# Patient Record
Sex: Female | Born: 1975 | Race: Black or African American | Hispanic: No | Smoking: Former smoker
Health system: Southern US, Community
[De-identification: ages and names within clinical notes are randomized; demographics above are authoritative.]

---
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
## 2017-11-20 ENCOUNTER — Encounter: Payer: Self-pay | Admitting: Adult Health

## 2017-11-26 ENCOUNTER — Encounter (HOSPITAL_COMMUNITY): Payer: Self-pay | Admitting: Hematology

## 2017-11-26 ENCOUNTER — Inpatient Hospital Stay (HOSPITAL_COMMUNITY): Payer: BLUE CROSS/BLUE SHIELD | Attending: Hematology | Admitting: Hematology

## 2017-11-26 VITALS — BP 143/76 | HR 103 | Temp 98.2°F | Resp 20 | Ht 67.0 in | Wt 376.0 lb

## 2017-11-26 DIAGNOSIS — Z79899 Other long term (current) drug therapy: Secondary | ICD-10-CM | POA: Diagnosis not present

## 2017-11-26 DIAGNOSIS — Z7952 Long term (current) use of systemic steroids: Secondary | ICD-10-CM

## 2017-11-26 DIAGNOSIS — D693 Immune thrombocytopenic purpura: Secondary | ICD-10-CM | POA: Diagnosis not present

## 2017-11-26 DIAGNOSIS — M25569 Pain in unspecified knee: Secondary | ICD-10-CM | POA: Diagnosis not present

## 2017-11-26 DIAGNOSIS — D508 Other iron deficiency anemias: Secondary | ICD-10-CM | POA: Diagnosis not present

## 2017-11-26 DIAGNOSIS — Z5112 Encounter for antineoplastic immunotherapy: Secondary | ICD-10-CM | POA: Diagnosis not present

## 2017-11-26 DIAGNOSIS — R040 Epistaxis: Secondary | ICD-10-CM | POA: Diagnosis not present

## 2017-11-26 DIAGNOSIS — M549 Dorsalgia, unspecified: Secondary | ICD-10-CM | POA: Diagnosis not present

## 2017-11-26 DIAGNOSIS — C9000 Multiple myeloma not having achieved remission: Secondary | ICD-10-CM

## 2017-11-26 DIAGNOSIS — K1379 Other lesions of oral mucosa: Secondary | ICD-10-CM | POA: Diagnosis not present

## 2017-11-26 DIAGNOSIS — Z87891 Personal history of nicotine dependence: Secondary | ICD-10-CM | POA: Insufficient documentation

## 2017-11-26 DIAGNOSIS — R58 Hemorrhage, not elsewhere classified: Secondary | ICD-10-CM | POA: Insufficient documentation

## 2017-11-26 DIAGNOSIS — R350 Frequency of micturition: Secondary | ICD-10-CM | POA: Diagnosis not present

## 2017-11-26 LAB — COMPREHENSIVE METABOLIC PANEL
ALT: 22 U/L (ref 14–54)
ANION GAP: 9 (ref 5–15)
AST: 25 U/L (ref 15–41)
Albumin: 3.3 g/dL — ABNORMAL LOW (ref 3.5–5.0)
Alkaline Phosphatase: 45 U/L (ref 38–126)
BUN: 21 mg/dL — ABNORMAL HIGH (ref 6–20)
CHLORIDE: 100 mmol/L — AB (ref 101–111)
CO2: 23 mmol/L (ref 22–32)
CREATININE: 0.74 mg/dL (ref 0.44–1.00)
Calcium: 9 mg/dL (ref 8.9–10.3)
GFR calc Af Amer: >60 mL/min (ref >60)
GFR calc non Af Amer: >60 mL/min (ref >60)
Glucose, Bld: 132 mg/dL — ABNORMAL HIGH (ref 65–99)
POTASSIUM: 4.4 mmol/L (ref 3.5–5.1)
SODIUM: 132 mmol/L — AB (ref 135–145)
Total Bilirubin: 1.4 mg/dL — ABNORMAL HIGH (ref 0.3–1.2)
Total Protein: 10 g/dL — ABNORMAL HIGH (ref 6.5–8.1)

## 2017-11-26 LAB — CBC WITH DIFFERENTIAL/PLATELET
BASOS ABS: 0 10*3/uL (ref 0.0–0.1)
Basophils Relative: 0 %
EOS ABS: 0 10*3/uL (ref 0.0–0.7)
Eosinophils Relative: 0 %
HCT: 35.4 % — ABNORMAL LOW (ref 36.0–46.0)
Hemoglobin: 11.7 g/dL — ABNORMAL LOW (ref 12.0–15.0)
LYMPHS PCT: 15 %
Lymphs Abs: 2.4 10*3/uL (ref 0.7–4.0)
MCH: 28.2 pg (ref 26.0–34.0)
MCHC: 33.1 g/dL (ref 30.0–36.0)
MCV: 85.3 fL (ref 78.0–100.0)
MONO ABS: 0.8 10*3/uL (ref 0.1–1.0)
Monocytes Relative: 5 %
NEUTROS ABS: 12.9 10*3/uL — AB (ref 1.7–7.7)
NEUTROS PCT: 80 %
PLATELETS: 146 10*3/uL — AB (ref 150–400)
RBC: 4.15 MIL/uL (ref 3.87–5.11)
RDW: 14 % (ref 11.5–15.5)
WBC: 16.1 10*3/uL — ABNORMAL HIGH (ref 4.0–10.5)

## 2017-11-26 LAB — LACTATE DEHYDROGENASE: LDH: 220 U/L — ABNORMAL HIGH (ref 98–192)

## 2017-11-26 MED ORDER — GENERIC EXTERNAL MEDICATION
1.00 | Status: DC
Start: ? — End: 2017-11-26

## 2017-11-26 MED ORDER — GENERIC EXTERNAL MEDICATION
120.00 | Status: DC
Start: 2017-11-25 — End: 2017-11-26

## 2017-11-26 MED ORDER — GENERIC EXTERNAL MEDICATION
0.30 | Status: DC
Start: ? — End: 2017-11-26

## 2017-11-26 MED ORDER — DIPHENHYDRAMINE HCL 50 MG/ML IJ SOLN
25.00 | INTRAMUSCULAR | Status: DC
Start: ? — End: 2017-11-26

## 2017-11-26 MED ORDER — DIPHENHYDRAMINE HCL 50 MG/ML IJ SOLN
50.00 | INTRAMUSCULAR | Status: DC
Start: ? — End: 2017-11-26

## 2017-11-26 MED ORDER — FAMOTIDINE 20 MG PO TABS
20.00 | ORAL_TABLET | ORAL | Status: DC
Start: ? — End: 2017-11-26

## 2017-11-26 MED ORDER — ACETAMINOPHEN 325 MG PO TABS
650.00 | ORAL_TABLET | ORAL | Status: DC
Start: ? — End: 2017-11-26

## 2017-11-26 MED ORDER — HYDROCORTISONE NA SUCCINATE PF 100 MG IJ SOLR
100.00 | INTRAMUSCULAR | Status: DC
Start: ? — End: 2017-11-26

## 2017-11-26 MED ORDER — ACETAMINOPHEN 325 MG PO TABS
325.00 | ORAL_TABLET | ORAL | Status: DC
Start: ? — End: 2017-11-26

## 2017-11-26 MED ORDER — PANTOPRAZOLE SODIUM 40 MG PO TBEC
40.00 | DELAYED_RELEASE_TABLET | ORAL | Status: DC
Start: 2017-11-25 — End: 2017-11-26

## 2017-11-26 MED ORDER — MELATONIN 3 MG PO TABS
3.00 | ORAL_TABLET | ORAL | Status: DC
Start: 2017-11-24 — End: 2017-11-26

## 2017-11-26 MED ORDER — ALUMINUM-MAGNESIUM-SIMETHICONE 200-200-20 MG/5ML PO SUSP
30.00 | ORAL | Status: DC
Start: ? — End: 2017-11-26

## 2017-11-26 MED ORDER — NORGESTIMATE-ETH ESTRADIOL 0.25-35 MG-MCG PO TABS
1.00 | ORAL_TABLET | ORAL | Status: DC
Start: 2017-11-25 — End: 2017-11-26

## 2017-11-26 NOTE — Assessment & Plan Note (Addendum)
1.  Immune thrombocytopenia: -Admitted to [facility] on 11/19/2017, with generalized bruising, bleeding gums and heavy menses, found to have a platelet count of 0, treated with IVIG 400 mg/kg/day for 5 days, and prednisone 175 mg daily which was decreased to 100 mg daily at this time - CT scan of the neck, chest, abdomen and pelvis on 11/20/2017 did not show any evidence of mass or adenopathy.  No hepatosplenomegaly. -Peripheral smear showed neutrophilia and thrombocytopenia, no platelet clumps. -Platelet count on day of discharge improved to 31 -We will have checked her platelet count today in our office which improved to 146.  I plan to cut back on prednisone to 80 mg.  We will check her platelet count in a week and continue to taper it.

## 2017-11-26 NOTE — Addendum Note (Signed)
Addended by: Derek Jack on: 11/26/2017 06:47 PM   Modules accepted: Orders

## 2017-11-26 NOTE — Progress Notes (Signed)
CONSULT NOTE  Patient Care Team: Emelda Fear, DO as PCP - General (Family Medicine)  CHIEF COMPLAINTS/PURPOSE OF CONSULTATION:  Severe low platelet count.  HISTORY OF PRESENTING ILLNESS:  Tracey Maldonado 42 y.o. female is seen in consultation today for follow-up of severe thrombocytopenia. She has noticed bruising on her right hand in the last week of February along with blisters in her mouth.  She continued to have on and off bruising over the next month.  As she had developed nosebleeds and worsening of menstrual bleeding along with completes, she went to the emergency room in [place].  She was then shipped to [facility].  She was diagnosed clinically with ITP, given IVIG and steroids.  She also had CT scan of the chest, abdomen and pelvis which did not reveal any pathology.  She was discharged home on Saturday.  She is currently on prednisone 100 mg daily.  Prior to this she was only taking Advil almost daily for knee pain and back pain.  She denies using any quinine products or drinking tonic water.  She works at [employer] in [place].  No family history of platelet disorders.  Denies any fevers, night sweats or weight loss in the last 6 months.  MEDICAL HISTORY:  History reviewed. No pertinent past medical history.  SURGICAL HISTORY: Past Surgical History:  Procedure Laterality Date  . APPENDECTOMY  1984    SOCIAL HISTORY: Social History   Socioeconomic History  . Marital status: Widowed    Spouse name: Not on file  . Number of children: Not on file  . Years of education: Not on file  . Highest education level: Not on file  Occupational History  . Not on file  Social Needs  . Financial resource strain: Not on file  . Food insecurity:    Worry: Not on file    Inability: Not on file  . Transportation needs:    Medical: Not on file    Non-medical: Not on file  Tobacco Use  . Smoking status: Former Smoker    Packs/day: 0.25    Years:  5.00    Pack years: 1.25    Types: Cigarettes    Last attempt to quit: 11/26/2001    Years since quitting: 16.0  . Smokeless tobacco: Never Used  Substance and Sexual Activity  . Alcohol use: Yes    Frequency: Never    Comment: Occassionally  . Drug use: Never  . Sexual activity: Not on file  Lifestyle  . Physical activity:    Days per week: Not on file    Minutes per session: Not on file  . Stress: Not on file  Relationships  . Social connections:    Talks on phone: Not on file    Gets together: Not on file    Attends religious service: Not on file    Active member of club or organization: Not on file    Attends meetings of clubs or organizations: Not on file    Relationship status: Not on file  . Intimate partner violence:    Fear of current or ex partner: Not on file    Emotionally abused: Not on file    Physically abused: Not on file    Forced sexual activity: Not on file  Other Topics Concern  . Not on file  Social History Narrative  . Not on file    FAMILY HISTORY: Family History  Problem Relation Age of Onset  . Hypertension Mother   .  Diabetes Paternal Grandmother     ALLERGIES:  has No Known Allergies.  MEDICATIONS:  Current Outpatient Medications  Medication Sig Dispense Refill  . [facility] PAIN RELIEF REGULAR ST 325 MG tablet     . ESTARYLLA 0.25-35 MG-MCG tablet     . famotidine (PEPCID) 20 MG tablet     . predniSONE (DELTASONE) 20 MG tablet      No current facility-administered medications for this visit.     REVIEW OF SYSTEMS:   Constitutional: Denies fevers, chills or abnormal night sweats Eyes: Denies blurriness of vision, double vision or watery eyes Ears, nose, mouth, throat, and face: Denies mucositis or sore throat Respiratory: Denies cough, dyspnea or wheezes Cardiovascular: Denies palpitation, chest discomfort or lower extremity swelling Gastrointestinal:  Denies nausea, heartburn or change in bowel habits Skin: Denies abnormal skin  rashes Lymphatics: Denies new lymphadenopathy or easy bruising Neurological:Denies numbness, tingling or new weaknesses Behavioral/Psych: Mood is stable, no new changes  All other systems were reviewed with the patient and are negative.  PHYSICAL EXAMINATION: ECOG PERFORMANCE STATUS: 0 - Asymptomatic  Vitals:   11/26/17 1512  BP: (!) 143/76  Pulse: (!) 103  Resp: 20  Temp: 98.2 F (36.8 C)  SpO2: 100%   Filed Weights   11/26/17 1512  Weight: (!) 376 lb (170.6 kg)    GENERAL:alert, no distress and comfortable EYES: normal, conjunctiva are pink and non-injected, sclera clear OROPHARYNX:no exudate, no erythema and lips, buccal mucosa, and tongue normal  NECK: supple, thyroid normal size, non-tender, without nodularity LYMPH:  no palpable lymphadenopathy in the cervical, axillary or inguinal LUNGS: clear to auscultation and percussion with normal breathing effort HEART: regular rate & rhythm and no murmurs and no lower extremity edema ABDOMEN:abdomen soft, non-tender and normal bowel sounds Musculoskeletal:no cyanosis of digits and no clubbing  PSYCH: alert & oriented x 3 with fluent speech NEURO: no focal motor/sensory deficits Skin: She has ecchymosis on the upper and lower extremities and anterior abdominal wall.  LABORATORY DATA:  I have reviewed the data as listed Recent Results (from the past 2160 hour(s))  CBC with Differential/Platelet     Status: Abnormal (Preliminary result)   Collection Time: 11/26/17  3:41 PM  Result Value Ref Range   WBC PENDING 4.0 - 10.5 K/uL   RBC 4.15 3.87 - 5.11 MIL/uL   Hemoglobin 11.7 (L) 12.0 - 15.0 g/dL   HCT 35.4 (L) 36.0 - 46.0 %   MCV 85.3 78.0 - 100.0 fL   MCH 28.2 26.0 - 34.0 pg   MCHC 33.1 30.0 - 36.0 g/dL   RDW 14.0 11.5 - 15.5 %   Platelets 146 (L) 150 - 400 K/uL    Comment: Performed at [facility], [address] [place], [place] [address]   Neutrophils Relative % PENDING %   Neutro Abs PENDING 1.7 - 7.7 K/uL   Band  Neutrophils PENDING %   Lymphocytes Relative PENDING %   Lymphs Abs PENDING 0.7 - 4.0 K/uL   Monocytes Relative PENDING %   Monocytes Absolute PENDING 0.1 - 1.0 K/uL   Eosinophils Relative PENDING %   Eosinophils Absolute PENDING 0.0 - 0.7 K/uL   Basophils Relative PENDING %   Basophils Absolute PENDING 0.0 - 0.1 K/uL   WBC Morphology PENDING    RBC Morphology PENDING    Smear Review PENDING    nRBC PENDING 0 /100 WBC   Metamyelocytes Relative PENDING %   Myelocytes PENDING %   Promyelocytes Relative PENDING %  Blasts PENDING %  Comprehensive metabolic panel     Status: Abnormal   Collection Time: 11/26/17  3:41 PM  Result Value Ref Range   Sodium 132 (L) 135 - 145 mmol/L   Potassium 4.4 3.5 - 5.1 mmol/L   Chloride 100 (L) 101 - 111 mmol/L   CO2 23 22 - 32 mmol/L   Glucose, Bld 132 (H) 65 - 99 mg/dL   BUN 21 (H) 6 - 20 mg/dL   Creatinine, Ser 0.74 0.44 - 1.00 mg/dL   Calcium 9.0 8.9 - 10.3 mg/dL   Total Protein 10.0 (H) 6.5 - 8.1 g/dL   Albumin 3.3 (L) 3.5 - 5.0 g/dL   AST 25 15 - 41 U/L   ALT 22 14 - 54 U/L   Alkaline Phosphatase 45 38 - 126 U/L   Total Bilirubin 1.4 (H) 0.3 - 1.2 mg/dL   GFR calc non Af Amer >60 >60 mL/min   GFR calc Af Amer >60 >60 mL/min    Comment: (NOTE) The eGFR has been calculated using the CKD EPI equation. This calculation has not been validated in all clinical situations. eGFR's persistently <60 mL/min signify possible Chronic Kidney Disease.    Anion gap 9 5 - 15    Comment: Performed at [facility], [address] [place], [place] [address]  Lactate dehydrogenase     Status: Abnormal   Collection Time: 11/26/17  3:41 PM  Result Value Ref Range   LDH 220 (H) 98 - 192 U/L    Comment: Performed at [facility], [address] [place], [place] [address]    RADIOGRAPHIC STUDIES: I have reviewed CT scan of the neck, chest, abdomen and pelvis from 11/20/2017 done at [facility] which did not show any evidence of adenopathy or  masses.  No hepatosplenomegaly was noted.  ASSESSMENT & PLAN:  Idiopathic thrombocytopenic purpura (ITP) (HCC) 1.  Immune thrombocytopenia: -Admitted to [facility] on 11/19/2017, with generalized bruising, bleeding gums and heavy menses, found to have a platelet count of 0, treated with IVIG 400 mg/kg/day for 5 days, and prednisone 175 mg daily which was decreased to 100 mg daily at this time - CT scan of the neck, chest, abdomen and pelvis on 11/20/2017 did not show any evidence of mass or adenopathy.  No hepatosplenomegaly. -Peripheral smear showed neutrophilia and thrombocytopenia, no platelet clumps. -Platelet count on day of discharge improved to 31 -We will have checked her platelet count today in our office which improved to 146.  I plan to cut back on prednisone to 80 mg.  We will check her platelet count in a week and continue to taper it.      Derek Jack, MD 11/26/17 5:09 PM

## 2017-11-26 NOTE — Addendum Note (Signed)
Addended by: Derek Jack on: 11/26/2017 05:11 PM   Modules accepted: Orders

## 2017-12-03 ENCOUNTER — Other Ambulatory Visit: Payer: Self-pay

## 2017-12-03 ENCOUNTER — Inpatient Hospital Stay (HOSPITAL_BASED_OUTPATIENT_CLINIC_OR_DEPARTMENT_OTHER): Payer: BLUE CROSS/BLUE SHIELD | Admitting: Hematology

## 2017-12-03 ENCOUNTER — Inpatient Hospital Stay (HOSPITAL_COMMUNITY): Payer: BLUE CROSS/BLUE SHIELD

## 2017-12-03 ENCOUNTER — Encounter (HOSPITAL_COMMUNITY): Payer: Self-pay | Admitting: Hematology

## 2017-12-03 DIAGNOSIS — R5383 Other fatigue: Secondary | ICD-10-CM

## 2017-12-03 DIAGNOSIS — D693 Immune thrombocytopenic purpura: Secondary | ICD-10-CM | POA: Diagnosis not present

## 2017-12-03 DIAGNOSIS — Z79899 Other long term (current) drug therapy: Secondary | ICD-10-CM | POA: Diagnosis not present

## 2017-12-03 DIAGNOSIS — Z7952 Long term (current) use of systemic steroids: Secondary | ICD-10-CM

## 2017-12-03 DIAGNOSIS — Z87891 Personal history of nicotine dependence: Secondary | ICD-10-CM | POA: Diagnosis not present

## 2017-12-03 LAB — CBC
HEMATOCRIT: 37 % (ref 36.0–46.0)
Hemoglobin: 12.3 g/dL (ref 12.0–15.0)
MCH: 28.9 pg (ref 26.0–34.0)
MCHC: 33.2 g/dL (ref 30.0–36.0)
MCV: 87.1 fL (ref 78.0–100.0)
Platelets: 68 10*3/uL — ABNORMAL LOW (ref 150–400)
RBC: 4.25 MIL/uL (ref 3.87–5.11)
RDW: 15 % (ref 11.5–15.5)
WBC: 23.5 10*3/uL — ABNORMAL HIGH (ref 4.0–10.5)

## 2017-12-03 LAB — LACTATE DEHYDROGENASE: LDH: 193 U/L — ABNORMAL HIGH (ref 98–192)

## 2017-12-03 NOTE — Assessment & Plan Note (Addendum)
1.  Immune thrombocytopenia: -Admitted to [facility] on 11/19/2017, with generalized bruising, bleeding gums and heavy menses, found to have a platelet count of 0, treated with IVIG 400 mg/kg/day for 5 days, and prednisone 175 mg daily which was decreased to 100 mg daily at discharge.   - CT scan of the neck, chest, abdomen and pelvis on 11/20/2017 did not show any evidence of mass or adenopathy.  No hepatosplenomegaly. -Peripheral smear showed neutrophilia and thrombocytopenia, no platelet clumps.  Platelet count on day of discharge was 31,000. -Platelet count last week was 146.  I have cut back her prednisone to 80 mg.  Today her platelet count dropped to 68,000.  She does not report any bleeding.  Her mucosa did not show any petechia.  We will continue the prednisone at the same dose.  She will continue Pepcid to decrease gastric irritation.  I will recheck her platelet count next week.  2.  Decreased sleep: Most likely from steroids.  She will try melatonin over-the-counter medicine.

## 2017-12-03 NOTE — Progress Notes (Signed)
[place], [place] [address]   CLINIC:  Medical Oncology/Hematology  PCP:  Emelda Fear, DO [address] [place] [address] [phone]   REASON FOR VISIT:  Follow-up for immune thrombocytopenia.  CURRENT THERAPY: Prednisone 80 mg daily.   INTERVAL HISTORY:  Tracey Maldonado 42 y.o. female returns for follow-up of immune thrombocytopenia.  The last 1 week she denies any fevers or infections.  She denies any nosebleeds or other bleeding.  No recent bruises noted.  She is taking Pepcid along with prednisone 80 mg daily.  She reports decreased sleep.  She wants to return to work if her blood count allows.    REVIEW OF SYSTEMS:  Review of Systems  Constitutional: Negative.   HENT:  Negative.   Respiratory: Negative.   Cardiovascular: Negative.   Gastrointestinal: Negative.   Genitourinary: Negative.    Musculoskeletal: Negative.   Skin: Negative.   Neurological: Negative.   Hematological: Negative.   Psychiatric/Behavioral: Negative.      PAST MEDICAL/SURGICAL HISTORY:  History reviewed. No pertinent past medical history. Past Surgical History:  Procedure Laterality Date  . APPENDECTOMY  1984     SOCIAL HISTORY:  Social History   Socioeconomic History  . Marital status: Widowed    Spouse name: Not on file  . Number of children: Not on file  . Years of education: Not on file  . Highest education level: Not on file  Occupational History  . Not on file  Social Needs  . Financial resource strain: Not on file  . Food insecurity:    Worry: Not on file    Inability: Not on file  . Transportation needs:    Medical: Not on file    Non-medical: Not on file  Tobacco Use  . Smoking status: Former Smoker    Packs/day: 0.25    Years: 5.00    Pack years: 1.25    Types: Cigarettes    Last attempt to quit: 11/26/2001    Years since quitting: 16.0  . Smokeless tobacco: Never Used  Substance and Sexual Activity  . Alcohol use:  Yes    Frequency: Never    Comment: Occassionally  . Drug use: Never  . Sexual activity: Not on file  Lifestyle  . Physical activity:    Days per week: Not on file    Minutes per session: Not on file  . Stress: Not on file  Relationships  . Social connections:    Talks on phone: Not on file    Gets together: Not on file    Attends religious service: Not on file    Active member of club or organization: Not on file    Attends meetings of clubs or organizations: Not on file    Relationship status: Not on file  . Intimate partner violence:    Fear of current or ex partner: Not on file    Emotionally abused: Not on file    Physically abused: Not on file    Forced sexual activity: Not on file  Other Topics Concern  . Not on file  Social History Narrative  . Not on file    FAMILY HISTORY:  Family History  Problem Relation Age of Onset  . Hypertension Mother   . Diabetes Paternal Grandmother     CURRENT MEDICATIONS:  Outpatient Encounter Medications as of 12/03/2017  Medication Sig  . [facility] PAIN RELIEF REGULAR ST 325 MG tablet   . ESTARYLLA 0.25-35 MG-MCG tablet   .  famotidine (PEPCID) 20 MG tablet   . predniSONE (DELTASONE) 20 MG tablet    No facility-administered encounter medications on file as of 12/03/2017.     ALLERGIES:  No Known Allergies   PHYSICAL EXAM:  ECOG Performance status: 0 Vitals:   12/03/17 1057  BP: 139/80  Pulse: 83  Resp: 18  Temp: 97.7 F (36.5 C)  SpO2: 98%   Filed Weights   12/03/17 1057  Weight: (!) 378 lb (171.5 kg)    Physical Exam  HENT:  No petechiae or purpura in the oral cavity or tongue.  Skin:  No new petechia was seen.  Old bruising on the forearms stable.     LABORATORY DATA:  I have reviewed the labs as listed.  CBC    Component Value Date/Time   WBC 23.5 (H) 12/03/2017 1152   RBC 4.25 12/03/2017 1152   HGB 12.3 12/03/2017 1152   HCT 37.0 12/03/2017 1152   PLT 68 (L) 12/03/2017 1152   MCV 87.1 12/03/2017  1152   MCH 28.9 12/03/2017 1152   MCHC 33.2 12/03/2017 1152   RDW 15.0 12/03/2017 1152   LYMPHSABS 2.4 11/26/2017 1541   MONOABS 0.8 11/26/2017 1541   EOSABS 0.0 11/26/2017 1541   BASOSABS 0.0 11/26/2017 1541   CMP Latest Ref Rng & Units 11/26/2017  Glucose 65 - 99 mg/dL 132(H)  BUN 6 - 20 mg/dL 21(H)  Creatinine 0.44 - 1.00 mg/dL 0.74  Sodium 135 - 145 mmol/L 132(L)  Potassium 3.5 - 5.1 mmol/L 4.4  Chloride 101 - 111 mmol/L 100(L)  CO2 22 - 32 mmol/L 23  Calcium 8.9 - 10.3 mg/dL 9.0  Total Protein 6.5 - 8.1 g/dL 10.0(H)  Total Bilirubin 0.3 - 1.2 mg/dL 1.4(H)  Alkaline Phos 38 - 126 U/L 45  AST 15 - 41 U/L 25  ALT 14 - 54 U/L 22        ASSESSMENT & PLAN:   Idiopathic thrombocytopenic purpura (ITP) (HCC) 1.  Immune thrombocytopenia: -Admitted to [facility] on 11/19/2017, with generalized bruising, bleeding gums and heavy menses, found to have a platelet count of 0, treated with IVIG 400 mg/kg/day for 5 days, and prednisone 175 mg daily which was decreased to 100 mg daily at discharge.   - CT scan of the neck, chest, abdomen and pelvis on 11/20/2017 did not show any evidence of mass or adenopathy.  No hepatosplenomegaly. -Peripheral smear showed neutrophilia and thrombocytopenia, no platelet clumps.  Platelet count on day of discharge was 31,000. -Platelet count last week was 146.  I have cut back her prednisone to 80 mg.  Today her platelet count dropped to 68,000.  She does not report any bleeding.  Her mucosa did not show any petechia.  We will continue the prednisone at the same dose.  I will recheck her platelet count next week.      Orders placed this encounter:  Orders Placed This Encounter  Procedures  . [place], [place] [phone]

## 2017-12-05 LAB — PROTEIN ELECTROPHORESIS, SERUM
A/G RATIO SPE: 0.6 — AB (ref 0.7–1.7)
ALBUMIN ELP: 3.2 g/dL (ref 2.9–4.4)
ALPHA-1-GLOBULIN: 0.3 g/dL (ref 0.0–0.4)
Alpha-2-Globulin: 0.7 g/dL (ref 0.4–1.0)
BETA GLOBULIN: 1.3 g/dL (ref 0.7–1.3)
GAMMA GLOBULIN: 2.9 g/dL — AB (ref 0.4–1.8)
Globulin, Total: 5.2 g/dL — ABNORMAL HIGH (ref 2.2–3.9)
Total Protein ELP: 8.4 g/dL (ref 6.0–8.5)

## 2017-12-10 ENCOUNTER — Encounter (HOSPITAL_COMMUNITY): Payer: Self-pay | Admitting: Hematology

## 2017-12-10 ENCOUNTER — Inpatient Hospital Stay (HOSPITAL_BASED_OUTPATIENT_CLINIC_OR_DEPARTMENT_OTHER): Payer: BLUE CROSS/BLUE SHIELD | Admitting: Hematology

## 2017-12-10 ENCOUNTER — Inpatient Hospital Stay (HOSPITAL_COMMUNITY): Payer: BLUE CROSS/BLUE SHIELD

## 2017-12-10 ENCOUNTER — Other Ambulatory Visit: Payer: Self-pay

## 2017-12-10 VITALS — BP 136/90 | HR 105 | Temp 98.9°F | Resp 18 | Wt 381.8 lb

## 2017-12-10 DIAGNOSIS — Z7952 Long term (current) use of systemic steroids: Secondary | ICD-10-CM | POA: Diagnosis not present

## 2017-12-10 DIAGNOSIS — Z79899 Other long term (current) drug therapy: Secondary | ICD-10-CM

## 2017-12-10 DIAGNOSIS — R040 Epistaxis: Secondary | ICD-10-CM

## 2017-12-10 DIAGNOSIS — D693 Immune thrombocytopenic purpura: Secondary | ICD-10-CM

## 2017-12-10 DIAGNOSIS — M25569 Pain in unspecified knee: Secondary | ICD-10-CM | POA: Diagnosis not present

## 2017-12-10 DIAGNOSIS — M549 Dorsalgia, unspecified: Secondary | ICD-10-CM

## 2017-12-10 DIAGNOSIS — K1379 Other lesions of oral mucosa: Secondary | ICD-10-CM | POA: Diagnosis not present

## 2017-12-10 DIAGNOSIS — D508 Other iron deficiency anemias: Secondary | ICD-10-CM | POA: Diagnosis not present

## 2017-12-10 DIAGNOSIS — R58 Hemorrhage, not elsewhere classified: Secondary | ICD-10-CM | POA: Diagnosis not present

## 2017-12-10 LAB — CBC
HEMATOCRIT: 38.9 % (ref 36.0–46.0)
HEMOGLOBIN: 12.5 g/dL (ref 12.0–15.0)
MCH: 28.7 pg (ref 26.0–34.0)
MCHC: 32.1 g/dL (ref 30.0–36.0)
MCV: 89.4 fL (ref 78.0–100.0)
Platelets: 42 10*3/uL — ABNORMAL LOW (ref 150–400)
RBC: 4.35 MIL/uL (ref 3.87–5.11)
RDW: 15.5 % (ref 11.5–15.5)
WBC: 21.8 10*3/uL — ABNORMAL HIGH (ref 4.0–10.5)

## 2017-12-10 NOTE — Patient Instructions (Signed)
[facility] Discharge Instructions  Today you saw Dr. K.   Thank you for choosing [facility] at [facility] to provide your oncology and hematology care.  To afford each patient quality time with our provider, please arrive at least 15 minutes before your scheduled appointment time.   If you have a lab appointment with the Cancer Center please come in thru the  Main Entrance and check in at the main information desk  You need to re-schedule your appointment should you arrive 10 or more minutes late.  We strive to give you quality time with our providers, and arriving late affects you and other patients whose appointments are after yours.  Also, if you no show three or more times for appointments you may be dismissed from the clinic at the providers discretion.     Again, thank you for choosing [facility].  Our hope is that these requests will decrease the amount of time that you wait before being seen by our physicians.       _____________________________________________________________  Should you have questions after your visit to  Cancer Center, please contact our office at [phone] between the hours of 8:30 a.m. and 4:30 p.m.  Voicemails left after 4:30 p.m. will not be returned until the following business day.  For prescription refill requests, have your pharmacy contact our office.       Resources For Cancer Patients and their Caregivers ? American Cancer Society: Can assist with transportation, wigs, general needs, runs Look Good Feel Better.        1-888-227-6333 ? Cancer Care: Provides financial assistance, online support groups, medication/co-pay assistance.  1-800-813-HOPE (4673) ? [facility] Assists [place] cancer patients and their families through emotional , educational and financial support.  336-427-4357 ? [place] DSS Where to apply for food  stamps, Medicaid and utility assistance. 336-342-1394 ? [facility]: Transportation to medical appointments. 336-347-2287 ? Social Security Administration: May apply for disability if have a Stage IV cancer. 336-342-7796 1-800-772-1213 ? [place] Aging, Disability and Transit Services: Assists with nutrition, care and transit needs. 336-349-2343  Cancer Center Support Programs:   > Cancer Support Group  2nd Tuesday of the month 1pm-2pm, Journey Room   > Creative Journey  3rd Tuesday of the month 1130am-1pm, Journey Room    

## 2017-12-10 NOTE — Assessment & Plan Note (Signed)
1.  Immune thrombocytopenia: -Admitted to [facility] on 11/19/2017, with generalized bruising, bleeding gums and heavy menses, found to have a platelet count of 0, treated with IVIG 400 mg/kg/day for 5 days, and prednisone 175 mg daily which was decreased to 100 mg daily at discharge.   - CT scan of the neck, chest, abdomen and pelvis on 11/20/2017 did not show any evidence of mass or adenopathy.  No hepatosplenomegaly. -Peripheral smear showed neutrophilia and thrombocytopenia, no platelet clumps.  Platelet count on day of discharge was 31,000. -Platelet count improved to 146 on 11/26/2017, prednisone dose was decreased from 100 mg to 80 mg daily.  Platelet count fell to 68,000 on 12/03/2017.  Today the platelet count is 42,000.  Patient denies any new bruising but had very small nosebleed this morning.  She is also having increased urination since she started steroids.  We talked about second line therapy for immune thrombocytopenia which include splenectomy versus rituximab and TPO-RA.  After discussing the risks and benefits of splenectomy, we decided against it.  I talked to her about starting her on rituximab weekly x4 with the response rates of 55-65%.  The responses are usually more durable lasting 1-2 years for right eczema.  We talked about the various side effects of rituximab.  We will tentatively plan her to start treatment later this week as her platelet count is coming down.  We will try to taper off the steroids slowly.  I will see her back in 1 week prior to her second infusion of rituximab.  I will send a hepatitis panel today.  2.  Decreased sleep: Most likely from steroids.  She will try melatonin over-the-counter medicine.

## 2017-12-10 NOTE — Progress Notes (Signed)
START OFF PATHWAY REGIMEN - Other Dx   [id number]:Rituximab (Weekly):   Administer weekly:     Rituximab   **Always confirm dose/schedule in your pharmacy ordering system**  Patient Characteristics: Intent of Therapy: Non-Curative / Palliative Intent, Discussed with Patient 

## 2017-12-10 NOTE — Progress Notes (Signed)
[place], [place] [address]   CLINIC:  Medical Oncology/Hematology  PCP:  Emelda Fear, DO [address] [place] [address] [phone]   REASON FOR VISIT:  Follow-up for immune thrombocytopenia.  CURRENT THERAPY: Prednisone 80 mg daily.    INTERVAL HISTORY:  Tracey Maldonado 42 y.o. female returns for follow-up of her thrombocytopenia.  She is taking in prednisone 80 mg daily.  She reports increased frequency of urination.  She denies any dysuria.  She started working last week.  She did not notice any new ecchymosis on the extremities.  She had minor nosebleed this morning.  Denies any other bleeding.  Denies any fevers or infections.  Denies any hospitalizations.  REVIEW OF SYSTEMS:  Review of Systems  Constitutional: Negative.   HENT:  Negative.   Respiratory: Negative.   Cardiovascular: Negative.   Gastrointestinal: Negative.   Genitourinary: Positive for frequency.   Musculoskeletal: Negative.   Skin: Negative.   Neurological: Negative.   Hematological: Negative.   Psychiatric/Behavioral: Negative.      PAST MEDICAL/SURGICAL HISTORY:  History reviewed. No pertinent past medical history. Past Surgical History:  Procedure Laterality Date  . APPENDECTOMY  1984     SOCIAL HISTORY:  Social History   Socioeconomic History  . Marital status: Widowed    Spouse name: Not on file  . Number of children: Not on file  . Years of education: Not on file  . Highest education level: Not on file  Occupational History  . Not on file  Social Needs  . Financial resource strain: Not on file  . Food insecurity:    Worry: Not on file    Inability: Not on file  . Transportation needs:    Medical: Not on file    Non-medical: Not on file  Tobacco Use  . Smoking status: Former Smoker    Packs/day: 0.25    Years: 5.00    Pack years: 1.25    Types: Cigarettes    Last attempt to quit: 11/26/2001    Years since quitting: 16.0  .  Smokeless tobacco: Never Used  Substance and Sexual Activity  . Alcohol use: Yes    Frequency: Never    Comment: Occassionally  . Drug use: Never  . Sexual activity: Not on file  Lifestyle  . Physical activity:    Days per week: Not on file    Minutes per session: Not on file  . Stress: Not on file  Relationships  . Social connections:    Talks on phone: Not on file    Gets together: Not on file    Attends religious service: Not on file    Active member of club or organization: Not on file    Attends meetings of clubs or organizations: Not on file    Relationship status: Not on file  . Intimate partner violence:    Fear of current or ex partner: Not on file    Emotionally abused: Not on file    Physically abused: Not on file    Forced sexual activity: Not on file  Other Topics Concern  . Not on file  Social History Narrative  . Not on file    FAMILY HISTORY:  Family History  Problem Relation Age of Onset  . Hypertension Mother   . Diabetes Paternal Grandmother     CURRENT MEDICATIONS:  Outpatient Encounter Medications as of 12/10/2017  Medication Sig  . [employer] PAIN RELIEF REGULAR ST 325 MG tablet   .  ESTARYLLA 0.25-35 MG-MCG tablet   . famotidine (PEPCID) 20 MG tablet   . predniSONE (DELTASONE) 20 MG tablet Take 80 mg by mouth.    No facility-administered encounter medications on file as of 12/10/2017.     ALLERGIES:  No Known Allergies   PHYSICAL EXAM:  ECOG Performance status: 0  Vitals:   12/10/17 1045  BP: 136/90  Pulse: (!) 105  Resp: 18  Temp: 98.9 F (37.2 C)  SpO2: 100%   Filed Weights   12/10/17 1045  Weight: (!) 381 lb 12.8 oz (173.2 kg)    Physical Exam  Constitutional: She appears well-developed and well-nourished.  Psychiatric: She has a normal mood and affect. Her behavior is normal. Judgment and thought content normal.     LABORATORY DATA:  I have reviewed the labs as listed.  CBC    Component Value Date/Time   WBC 21.8 (H)  12/10/2017 1003   RBC 4.35 12/10/2017 1003   HGB 12.5 12/10/2017 1003   HCT 38.9 12/10/2017 1003   PLT 42 (L) 12/10/2017 1003   MCV 89.4 12/10/2017 1003   MCH 28.7 12/10/2017 1003   MCHC 32.1 12/10/2017 1003   RDW 15.5 12/10/2017 1003   LYMPHSABS 2.4 11/26/2017 1541   MONOABS 0.8 11/26/2017 1541   EOSABS 0.0 11/26/2017 1541   BASOSABS 0.0 11/26/2017 1541   CMP Latest Ref Rng & Units 11/26/2017  Glucose 65 - 99 mg/dL 132(H)  BUN 6 - 20 mg/dL 21(H)  Creatinine 0.44 - 1.00 mg/dL 0.74  Sodium 135 - 145 mmol/L 132(L)  Potassium 3.5 - 5.1 mmol/L 4.4  Chloride 101 - 111 mmol/L 100(L)  CO2 22 - 32 mmol/L 23  Calcium 8.9 - 10.3 mg/dL 9.0  Total Protein 6.5 - 8.1 g/dL 10.0(H)  Total Bilirubin 0.3 - 1.2 mg/dL 1.4(H)  Alkaline Phos 38 - 126 U/L 45  AST 15 - 41 U/L 25  ALT 14 - 54 U/L 22       ASSESSMENT & PLAN:   Idiopathic thrombocytopenic purpura (ITP) (HCC) 1.  Immune thrombocytopenia: -Admitted to [facility] on 11/19/2017, with generalized bruising, bleeding gums and heavy menses, found to have a platelet count of 0, treated with IVIG 400 mg/kg/day for 5 days, and prednisone 175 mg daily which was decreased to 100 mg daily at discharge.   - CT scan of the neck, chest, abdomen and pelvis on 11/20/2017 did not show any evidence of mass or adenopathy.  No hepatosplenomegaly. -Peripheral smear showed neutrophilia and thrombocytopenia, no platelet clumps.  Platelet count on day of discharge was 31,000. -Platelet count improved to 146 on 11/26/2017, prednisone dose was decreased from 100 mg to 80 mg daily.  Platelet count fell to 68,000 on 12/03/2017.  Today the platelet count is 42,000.  Patient denies any new bruising but had very small nosebleed this morning.  She is also having increased urination since she started steroids.  We talked about second line therapy for immune thrombocytopenia which include splenectomy versus rituximab and TPO-RA.  After discussing the risks and  benefits of splenectomy, we decided against it.  I talked to her about starting her on rituximab weekly x4 with the response rates of 55-65%.  The responses are usually more durable lasting 1-2 years for right eczema.  We talked about the various side effects of rituximab.  We will tentatively plan her to start treatment later this week as her platelet count is coming down.  We will try to taper off the steroids slowly.  I will see her back in 1 week prior to her second infusion of rituximab.  I will send a hepatitis panel today.  2.  Decreased sleep: Most likely from steroids.  She will try melatonin over-the-counter medicine.      Orders placed this encounter:  Orders Placed This Encounter  Procedures  . Hepatitis panel, acute   Total time spent is 25 minutes with more than 50% of the time spent face-to-face discussing treatment plan change, adverse effects.   Derek Jack, MD [place] [phone]

## 2017-12-11 LAB — HEPATITIS PANEL, ACUTE
HEP A IGM: NEGATIVE
HEP B C IGM: NEGATIVE
Hepatitis B Surface Ag: NEGATIVE

## 2017-12-12 ENCOUNTER — Other Ambulatory Visit (HOSPITAL_COMMUNITY): Payer: Self-pay

## 2017-12-12 DIAGNOSIS — D508 Other iron deficiency anemias: Secondary | ICD-10-CM

## 2017-12-12 DIAGNOSIS — C9000 Multiple myeloma not having achieved remission: Secondary | ICD-10-CM

## 2017-12-12 DIAGNOSIS — D693 Immune thrombocytopenic purpura: Secondary | ICD-10-CM

## 2017-12-13 ENCOUNTER — Inpatient Hospital Stay (HOSPITAL_COMMUNITY): Payer: BLUE CROSS/BLUE SHIELD

## 2017-12-13 ENCOUNTER — Encounter (HOSPITAL_COMMUNITY): Payer: Self-pay

## 2017-12-13 VITALS — BP 132/69 | HR 103 | Temp 98.2°F | Resp 18

## 2017-12-13 DIAGNOSIS — D693 Immune thrombocytopenic purpura: Secondary | ICD-10-CM

## 2017-12-13 DIAGNOSIS — D508 Other iron deficiency anemias: Secondary | ICD-10-CM

## 2017-12-13 DIAGNOSIS — C9000 Multiple myeloma not having achieved remission: Secondary | ICD-10-CM

## 2017-12-13 LAB — CBC WITH DIFFERENTIAL/PLATELET
Basophils Absolute: 0 10*3/uL (ref 0.0–0.1)
Basophils Relative: 0 %
EOS ABS: 0.2 10*3/uL (ref 0.0–0.7)
EOS PCT: 1 %
HCT: 36.8 % (ref 36.0–46.0)
HEMOGLOBIN: 11.9 g/dL — AB (ref 12.0–15.0)
LYMPHS ABS: 3.6 10*3/uL (ref 0.7–4.0)
Lymphocytes Relative: 22 %
MCH: 29.1 pg (ref 26.0–34.0)
MCHC: 32.3 g/dL (ref 30.0–36.0)
MCV: 90 fL (ref 78.0–100.0)
MONO ABS: 0.8 10*3/uL (ref 0.1–1.0)
Monocytes Relative: 5 %
Neutro Abs: 11.7 10*3/uL — ABNORMAL HIGH (ref 1.7–7.7)
Neutrophils Relative %: 72 %
PLATELETS: 31 10*3/uL — AB (ref 150–400)
RBC: 4.09 MIL/uL (ref 3.87–5.11)
RDW: 15.7 % — ABNORMAL HIGH (ref 11.5–15.5)
WBC: 16.3 10*3/uL — ABNORMAL HIGH (ref 4.0–10.5)

## 2017-12-13 LAB — COMPREHENSIVE METABOLIC PANEL
ALK PHOS: 39 U/L (ref 38–126)
ALT: 25 U/L (ref 14–54)
AST: 22 U/L (ref 15–41)
Albumin: 3.3 g/dL — ABNORMAL LOW (ref 3.5–5.0)
Anion gap: 12 (ref 5–15)
BUN: 12 mg/dL (ref 6–20)
CALCIUM: 9.3 mg/dL (ref 8.9–10.3)
CO2: 25 mmol/L (ref 22–32)
Chloride: 102 mmol/L (ref 101–111)
Creatinine, Ser: 0.77 mg/dL (ref 0.44–1.00)
GFR calc Af Amer: >60 mL/min (ref >60)
GFR calc non Af Amer: >60 mL/min (ref >60)
Glucose, Bld: 104 mg/dL — ABNORMAL HIGH (ref 65–99)
Potassium: 3.4 mmol/L — ABNORMAL LOW (ref 3.5–5.1)
SODIUM: 139 mmol/L (ref 135–145)
Total Bilirubin: 1 mg/dL (ref 0.3–1.2)
Total Protein: 7.7 g/dL (ref 6.5–8.1)

## 2017-12-13 MED ORDER — DIPHENHYDRAMINE HCL 50 MG/ML IJ SOLN
50.0000 mg | Freq: Once | INTRAMUSCULAR | Status: AC
  Administered 2017-12-13: 50 mg via INTRAVENOUS

## 2017-12-13 MED ORDER — DIPHENHYDRAMINE HCL 50 MG/ML IJ SOLN
INTRAMUSCULAR | Status: AC
  Filled 2017-12-13: qty 1

## 2017-12-13 MED ORDER — FAMOTIDINE IN NACL 20-0.9 MG/50ML-% IV SOLN
INTRAVENOUS | Status: AC
  Filled 2017-12-13: qty 50

## 2017-12-13 MED ORDER — FAMOTIDINE IN NACL 20-0.9 MG/50ML-% IV SOLN
20.0000 mg | Freq: Two times a day (BID) | INTRAVENOUS | Status: DC
  Administered 2017-12-13: 20 mg via INTRAVENOUS

## 2017-12-13 MED ORDER — ACETAMINOPHEN 325 MG PO TABS
ORAL_TABLET | ORAL | Status: AC
Start: 2017-12-13 — End: ?
  Filled 2017-12-13: qty 2

## 2017-12-13 MED ORDER — SODIUM CHLORIDE 0.9 % IV SOLN
375.0000 mg/m2 | Freq: Once | INTRAVENOUS | Status: AC
  Administered 2017-12-13: 1100 mg via INTRAVENOUS
  Filled 2017-12-13: qty 100

## 2017-12-13 MED ORDER — ACETAMINOPHEN 325 MG PO TABS
650.0000 mg | ORAL_TABLET | Freq: Once | ORAL | Status: AC
  Administered 2017-12-13: 650 mg via ORAL

## 2017-12-13 MED ORDER — SODIUM CHLORIDE 0.9 % IV SOLN
Freq: Once | INTRAVENOUS | Status: AC
  Administered 2017-12-13: 10:00:00 via INTRAVENOUS

## 2017-12-13 NOTE — Progress Notes (Signed)
Tracey Maldonado tolerated Rituxan infusion well without complaints or incident.Labs reviewed with Dr. Raliegh Ip prior to administering this medication. VSS throughout infusion and upon discharge. Pt discharged self ambulatory in satisfactory condition accompanied by family member

## 2017-12-13 NOTE — Patient Instructions (Addendum)
[facility] Discharge Instructions for Patients Receiving Chemotherapy   Beginning January 23rd 2017 lab work for the Cancer Center will be done in the  Main lab at  on 1st floor. If you have a lab appointment with the Cancer Center please come in thru the  Main Entrance and check in at the main information desk   Today you received the following chemotherapy agents Rituxan. Follow-up as scheduled. Call clinic for any questions or concerns  To help prevent nausea and vomiting after your treatment, we encourage you to take your nausea medication   If you develop nausea and vomiting, or diarrhea that is not controlled by your medication, call the clinic.  The clinic phone number is [phone]. Office hours are Monday-Friday 8:30am-5:00pm.  BELOW ARE SYMPTOMS THAT SHOULD BE REPORTED IMMEDIATELY:  *FEVER GREATER THAN 101.0 F  *CHILLS WITH OR WITHOUT FEVER  NAUSEA AND VOMITING THAT IS NOT CONTROLLED WITH YOUR NAUSEA MEDICATION  *UNUSUAL SHORTNESS OF BREATH  *UNUSUAL BRUISING OR BLEEDING  TENDERNESS IN MOUTH AND THROAT WITH OR WITHOUT PRESENCE OF ULCERS  *URINARY PROBLEMS  *BOWEL PROBLEMS  UNUSUAL RASH Items with * indicate a potential emergency and should be followed up as soon as possible. If you have an emergency after office hours please contact your primary care physician or go to the nearest emergency department.  Please call the clinic during office hours if you have any questions or concerns.   You may also contact the Patient Navigator at [phone] should you have any questions or need assistance in obtaining follow up care.      Resources For Cancer Patients and their Caregivers ? American Cancer Society: Can assist with transportation, wigs, general needs, runs Look Good Feel Better.        1-888-227-6333 ? Cancer Care: Provides financial assistance, online support groups, medication/co-pay assistance.  1-800-813-HOPE  (4673) ? [facility] Assists [place] cancer patients and their families through emotional , educational and financial support.  336-427-4357 ? [place] DSS Where to apply for food stamps, Medicaid and utility assistance. 336-342-1394 ? RCATS: Transportation to medical appointments. 336-347-2287 ? Social Security Administration: May apply for disability if have a Stage IV cancer. 336-342-7796 1-800-772-1213 ? [place] Aging, Disability and Transit Services: Assists with nutrition, care and transit needs. 336-349-2343         

## 2017-12-13 NOTE — Progress Notes (Signed)
Labs reviewed with MD, proceed with treatment today.   Consent and teaching done.  1315-patient noticed that her heart rate was slightly up and her scalp/head is itching/tingling a little. Vitals taken and MD notified.   1340-patient states she is feeling better, "it is really not bothering me" stated the patient. Vitals stable and proceed with treatment per MD.

## 2017-12-14 NOTE — Progress Notes (Signed)
24 hour follow up-patient states she is doing well today. She is at work, no issues to report.

## 2017-12-18 NOTE — Progress Notes (Signed)
Late entry for 12-14-2017- 24 hour call back done, spoke with patient today and she has no complaints or issues to report. She stated she felt good. She is working today.

## 2017-12-19 ENCOUNTER — Other Ambulatory Visit (HOSPITAL_COMMUNITY): Payer: Self-pay

## 2017-12-19 DIAGNOSIS — C9000 Multiple myeloma not having achieved remission: Secondary | ICD-10-CM

## 2017-12-19 DIAGNOSIS — D508 Other iron deficiency anemias: Secondary | ICD-10-CM

## 2017-12-19 DIAGNOSIS — D693 Immune thrombocytopenic purpura: Secondary | ICD-10-CM

## 2017-12-20 ENCOUNTER — Encounter (HOSPITAL_COMMUNITY): Payer: Self-pay | Admitting: Hematology

## 2017-12-20 ENCOUNTER — Other Ambulatory Visit: Payer: Self-pay

## 2017-12-20 ENCOUNTER — Inpatient Hospital Stay (HOSPITAL_COMMUNITY): Payer: BLUE CROSS/BLUE SHIELD | Attending: Hematology

## 2017-12-20 ENCOUNTER — Inpatient Hospital Stay (HOSPITAL_BASED_OUTPATIENT_CLINIC_OR_DEPARTMENT_OTHER): Payer: BLUE CROSS/BLUE SHIELD | Admitting: Hematology

## 2017-12-20 ENCOUNTER — Encounter (HOSPITAL_COMMUNITY): Payer: Self-pay

## 2017-12-20 ENCOUNTER — Inpatient Hospital Stay (HOSPITAL_COMMUNITY): Payer: BLUE CROSS/BLUE SHIELD

## 2017-12-20 VITALS — BP 112/84 | HR 87 | Temp 98.9°F | Resp 18

## 2017-12-20 DIAGNOSIS — R0609 Other forms of dyspnea: Secondary | ICD-10-CM

## 2017-12-20 DIAGNOSIS — Z5112 Encounter for antineoplastic immunotherapy: Secondary | ICD-10-CM | POA: Diagnosis not present

## 2017-12-20 DIAGNOSIS — R5383 Other fatigue: Secondary | ICD-10-CM

## 2017-12-20 DIAGNOSIS — D693 Immune thrombocytopenic purpura: Secondary | ICD-10-CM

## 2017-12-20 DIAGNOSIS — Z87891 Personal history of nicotine dependence: Secondary | ICD-10-CM | POA: Insufficient documentation

## 2017-12-20 DIAGNOSIS — R05 Cough: Secondary | ICD-10-CM | POA: Diagnosis not present

## 2017-12-20 DIAGNOSIS — R093 Abnormal sputum: Secondary | ICD-10-CM | POA: Diagnosis not present

## 2017-12-20 DIAGNOSIS — Z79899 Other long term (current) drug therapy: Secondary | ICD-10-CM | POA: Insufficient documentation

## 2017-12-20 DIAGNOSIS — R600 Localized edema: Secondary | ICD-10-CM | POA: Insufficient documentation

## 2017-12-20 DIAGNOSIS — R350 Frequency of micturition: Secondary | ICD-10-CM | POA: Diagnosis not present

## 2017-12-20 DIAGNOSIS — C9000 Multiple myeloma not having achieved remission: Secondary | ICD-10-CM

## 2017-12-20 DIAGNOSIS — Z7952 Long term (current) use of systemic steroids: Secondary | ICD-10-CM | POA: Diagnosis not present

## 2017-12-20 DIAGNOSIS — D508 Other iron deficiency anemias: Secondary | ICD-10-CM

## 2017-12-20 LAB — COMPREHENSIVE METABOLIC PANEL
ALT: 31 U/L (ref 14–54)
ANION GAP: 12 (ref 5–15)
AST: 28 U/L (ref 15–41)
Albumin: 3.2 g/dL — ABNORMAL LOW (ref 3.5–5.0)
Alkaline Phosphatase: 43 U/L (ref 38–126)
BILIRUBIN TOTAL: 0.9 mg/dL (ref 0.3–1.2)
BUN: 18 mg/dL (ref 6–20)
CO2: 25 mmol/L (ref 22–32)
Calcium: 8.9 mg/dL (ref 8.9–10.3)
Chloride: 102 mmol/L (ref 101–111)
Creatinine, Ser: 0.93 mg/dL (ref 0.44–1.00)
GFR calc non Af Amer: >60 mL/min (ref >60)
GLUCOSE: 124 mg/dL — AB (ref 65–99)
POTASSIUM: 3.7 mmol/L (ref 3.5–5.1)
SODIUM: 139 mmol/L (ref 135–145)
TOTAL PROTEIN: 7.1 g/dL (ref 6.5–8.1)

## 2017-12-20 LAB — CBC WITH DIFFERENTIAL/PLATELET
BASOS PCT: 0 %
Basophils Absolute: 0 10*3/uL (ref 0.0–0.1)
EOS ABS: 0.1 10*3/uL (ref 0.0–0.7)
Eosinophils Relative: 1 %
HEMATOCRIT: 39.3 % (ref 36.0–46.0)
Hemoglobin: 12.5 g/dL (ref 12.0–15.0)
LYMPHS ABS: 3.1 10*3/uL (ref 0.7–4.0)
Lymphocytes Relative: 18 %
MCH: 29.1 pg (ref 26.0–34.0)
MCHC: 31.8 g/dL (ref 30.0–36.0)
MCV: 91.4 fL (ref 78.0–100.0)
MONO ABS: 1.1 10*3/uL — AB (ref 0.1–1.0)
MONOS PCT: 6 %
Neutro Abs: 12.8 10*3/uL — ABNORMAL HIGH (ref 1.7–7.7)
Neutrophils Relative %: 75 %
Platelets: 67 10*3/uL — ABNORMAL LOW (ref 150–400)
RBC: 4.3 MIL/uL (ref 3.87–5.11)
RDW: 15.6 % — AB (ref 11.5–15.5)
WBC: 17.1 10*3/uL — ABNORMAL HIGH (ref 4.0–10.5)

## 2017-12-20 MED ORDER — ACETAMINOPHEN 325 MG PO TABS
ORAL_TABLET | ORAL | Status: AC
  Filled 2017-12-20: qty 2

## 2017-12-20 MED ORDER — RITUXIMAB CHEMO INJECTION 500 MG/50ML
375.0000 mg/m2 | Freq: Once | INTRAVENOUS | Status: AC
  Administered 2017-12-20: 1100 mg via INTRAVENOUS
  Filled 2017-12-20: qty 100

## 2017-12-20 MED ORDER — FAMOTIDINE IN NACL 20-0.9 MG/50ML-% IV SOLN
20.0000 mg | Freq: Two times a day (BID) | INTRAVENOUS | Status: DC
  Administered 2017-12-20: 20 mg via INTRAVENOUS

## 2017-12-20 MED ORDER — DIPHENHYDRAMINE HCL 50 MG/ML IJ SOLN
50.0000 mg | Freq: Once | INTRAMUSCULAR | Status: AC
  Administered 2017-12-20: 50 mg via INTRAVENOUS

## 2017-12-20 MED ORDER — ACETAMINOPHEN 325 MG PO TABS
650.0000 mg | ORAL_TABLET | Freq: Once | ORAL | Status: AC
  Administered 2017-12-20: 650 mg via ORAL

## 2017-12-20 MED ORDER — SODIUM CHLORIDE 0.9 % IV SOLN
Freq: Once | INTRAVENOUS | Status: AC
  Administered 2017-12-20: 11:00:00 via INTRAVENOUS

## 2017-12-20 MED ORDER — DIPHENHYDRAMINE HCL 50 MG/ML IJ SOLN
INTRAMUSCULAR | Status: AC
  Filled 2017-12-20: qty 1

## 2017-12-20 MED ORDER — FAMOTIDINE IN NACL 20-0.9 MG/50ML-% IV SOLN
INTRAVENOUS | Status: AC
  Filled 2017-12-20: qty 50

## 2017-12-20 NOTE — Progress Notes (Signed)
[place], [place] [address]   CLINIC:  Medical Oncology/Hematology  PCP:  Emelda Fear, DO [address] [place] [address] [phone]   REASON FOR VISIT:  Follow-up for immune thrombocytopenia.  CURRENT THERAPY: Prednisone 80 mg daily. + weekly Rituxan beginning on 12/13/17    INTERVAL HISTORY:  Ms. Zia 42 y.o. female returns for follow-up of her thrombocytopenia.    Received first dose of Rituxan on 12/13/17.  She is due for cycle #2 today. Planned for 4 cycles of Rituxan.   Endorses shortness of breath with minimal exertion.  This is new for her. She attributes some of this to her prednisone.  Endorses some fatigue; energy levels 75%. She is sleeping well. She was having urinary frequency, which has improved since she started potassium supplementation.    Platelets are improved 67 today (up from 31,000 on 12/13/17).  Will dose-reduce her prednisone to 60 mg today.       REVIEW OF SYSTEMS:  Review of Systems  Constitutional: Negative.   HENT:  Negative.   Respiratory: Negative.   Cardiovascular: Negative.   Gastrointestinal: Negative.   Genitourinary: Positive for frequency.   Musculoskeletal: Negative.   Skin: Negative.   Neurological: Negative.   Hematological: Negative.   Psychiatric/Behavioral: Negative.      PAST MEDICAL/SURGICAL HISTORY:  History reviewed. No pertinent past medical history. Past Surgical History:  Procedure Laterality Date  . APPENDECTOMY  1984     SOCIAL HISTORY:  Social History   Socioeconomic History  . Marital status: Widowed    Spouse name: Not on file  . Number of children: Not on file  . Years of education: Not on file  . Highest education level: Not on file  Occupational History  . Not on file  Social Needs  . Financial resource strain: Not on file  . Food insecurity:    Worry: Not on file    Inability: Not on file  . Transportation needs:    Medical: Not on file   Non-medical: Not on file  Tobacco Use  . Smoking status: Former Smoker    Packs/day: 0.25    Years: 5.00    Pack years: 1.25    Types: Cigarettes    Last attempt to quit: 11/26/2001    Years since quitting: 16.0  . Smokeless tobacco: Never Used  Substance and Sexual Activity  . Alcohol use: Yes    Frequency: Never    Comment: Occassionally  . Drug use: Never  . Sexual activity: Not on file  Lifestyle  . Physical activity:    Days per week: Not on file    Minutes per session: Not on file  . Stress: Not on file  Relationships  . Social connections:    Talks on phone: Not on file    Gets together: Not on file    Attends religious service: Not on file    Active member of club or organization: Not on file    Attends meetings of clubs or organizations: Not on file    Relationship status: Not on file  . Intimate partner violence:    Fear of current or ex partner: Not on file    Emotionally abused: Not on file    Physically abused: Not on file    Forced sexual activity: Not on file  Other Topics Concern  . Not on file  Social History Narrative  . Not on file    FAMILY HISTORY:  Family History  Problem Relation Age of Onset  . Hypertension Mother   . Diabetes Paternal Grandmother     CURRENT MEDICATIONS:  Outpatient Encounter Medications as of 12/20/2017  Medication Sig  . [employer] PAIN RELIEF REGULAR ST 325 MG tablet   . ESTARYLLA 0.25-35 MG-MCG tablet   . famotidine (PEPCID) 20 MG tablet   . predniSONE (DELTASONE) 20 MG tablet Take 60 mg by mouth.   No facility-administered encounter medications on file as of 12/20/2017.     ALLERGIES:  No Known Allergies   PHYSICAL EXAM:  ECOG Performance status: 0  I have reviewed her vital signs today. Physical Exam  Constitutional: She appears well-developed and well-nourished.  Psychiatric: She has a normal mood and affect. Her behavior is normal. Judgment and thought content normal.  No bruising or petechiae.   LABORATORY  DATA:  I have reviewed the labs as listed.  CBC    Component Value Date/Time   WBC 17.1 (H) 12/20/2017 0854   RBC 4.30 12/20/2017 0854   HGB 12.5 12/20/2017 0854   HCT 39.3 12/20/2017 0854   PLT 67 (L) 12/20/2017 0854   MCV 91.4 12/20/2017 0854   MCH 29.1 12/20/2017 0854   MCHC 31.8 12/20/2017 0854   RDW 15.6 (H) 12/20/2017 0854   LYMPHSABS 3.1 12/20/2017 0854   MONOABS 1.1 (H) 12/20/2017 0854   EOSABS 0.1 12/20/2017 0854   BASOSABS 0.0 12/20/2017 0854   CMP Latest Ref Rng & Units 12/20/2017 12/13/2017 11/26/2017  Glucose 65 - 99 mg/dL 124(H) 104(H) 132(H)  BUN 6 - 20 mg/dL 18 12 21(H)  Creatinine 0.44 - 1.00 mg/dL 0.93 0.77 0.74  Sodium 135 - 145 mmol/L 139 139 132(L)  Potassium 3.5 - 5.1 mmol/L 3.7 3.4(L) 4.4  Chloride 101 - 111 mmol/L 102 102 100(L)  CO2 22 - 32 mmol/L 25 25 23   Calcium 8.9 - 10.3 mg/dL 8.9 9.3 9.0  Total Protein 6.5 - 8.1 g/dL 7.1 7.7 10.0(H)  Total Bilirubin 0.3 - 1.2 mg/dL 0.9 1.0 1.4(H)  Alkaline Phos 38 - 126 U/L 43 39 45  AST 15 - 41 U/L 28 22 25   ALT 14 - 54 U/L 31 25 22        ASSESSMENT & PLAN:   Idiopathic thrombocytopenic purpura (ITP) (HCC) 1.  Immune thrombocytopenia: - Admitted to [facility] on 11/19/2017 with easy bruising and bleeding, platelet count 0, status post IVIG 400 mg/kg/day for 5 days, and prednisone 175 mg daily, decrease 200 mg daily at discharge -CT scan of the neck, chest, abdomen and pelvis on 11/20/2017 did not reveal any masses or adenopathy or splenomegaly, peripheral smear confirmed from cytopenia with no platelet counts - Platelet count started falling when patient on prednisone 80 mg daily to 31,000. - Weekly rituximab started on 12/13/2017, platelet count today improved to 67, tolerated first infusion very well except some mild tachycardia.  She did not experience any other side effects when she went home.  She does report shortness of breath on exertion.  Today I will cut back on prednisone to 60 mg daily.   The goal is to slow taper prednisone.  She will come back next week for week 3.  I will see her back in 2 weeks.     This note includes documentation from Mike Craze, NP, who was present during this patient's office visit and evaluation.  I have reviewed this note for its completeness and accuracy.  I have edited this note accordingly based on my findings and medical opinion.  Derek Jack, MD [place] [phone]

## 2017-12-20 NOTE — Progress Notes (Signed)
Tolerated infusion w/o adverse reaction.  Alert, in no distress.  VSS.  Discharged ambulatory.  

## 2017-12-20 NOTE — Assessment & Plan Note (Signed)
1.  Immune thrombocytopenia: - Admitted to [facility] on 11/19/2017 with easy bruising and bleeding, platelet count 0, status post IVIG 400 mg/kg/day for 5 days, and prednisone 175 mg daily, decrease 200 mg daily at discharge -CT scan of the neck, chest, abdomen and pelvis on 11/20/2017 did not reveal any masses or adenopathy or splenomegaly, peripheral smear confirmed from cytopenia with no platelet counts - Platelet count started falling when patient on prednisone 80 mg daily to 31,000. - Weekly rituximab started on 12/13/2017, platelet count today improved to 67, tolerated first infusion very well except some mild tachycardia.  She did not experience any other side effects when she went home.  She does report shortness of breath on exertion.  Today I will cut back on prednisone to 60 mg daily.  The goal is to slow taper prednisone.  She will come back next week for week 3.  I will see her back in 2 weeks.

## 2017-12-20 NOTE — Addendum Note (Signed)
Addended by: Derek Jack on: 12/20/2017 10:54 AM   Modules accepted: Orders

## 2017-12-20 NOTE — Patient Instructions (Signed)
[facility] Discharge Instructions  Today you saw Dr. K.   Thank you for choosing  Cancer Center at [facility] to provide your oncology and hematology care.  To afford each patient quality time with our provider, please arrive at least 15 minutes before your scheduled appointment time.   If you have a lab appointment with the Cancer Center please come in thru the  Main Entrance and check in at the main information desk  You need to re-schedule your appointment should you arrive 10 or more minutes late.  We strive to give you quality time with our providers, and arriving late affects you and other patients whose appointments are after yours.  Also, if you no show three or more times for appointments you may be dismissed from the clinic at the providers discretion.     Again, thank you for choosing [facility].  Our hope is that these requests will decrease the amount of time that you wait before being seen by our physicians.       _____________________________________________________________  Should you have questions after your visit to [facility], please contact our office at [phone] between the hours of 8:30 a.m. and 4:30 p.m.  Voicemails left after 4:30 p.m. will not be returned until the following business day.  For prescription refill requests, have your pharmacy contact our office.       Resources For Cancer Patients and their Caregivers ? American Cancer Society: Can assist with transportation, wigs, general needs, runs Look Good Feel Better.        1-888-227-6333 ? Cancer Care: Provides financial assistance, online support groups, medication/co-pay assistance.  1-800-813-HOPE (4673) ? [facility] Assists [place] cancer patients and their families through emotional , educational and financial support.  336-427-4357 ? [place] DSS Where to apply for food  stamps, Medicaid and utility assistance. 336-342-1394 ? RCATS: Transportation to medical appointments. 336-347-2287 ? Social Security Administration: May apply for disability if have a Stage IV cancer. 336-342-7796 1-800-772-1213 ? [place] Aging, Disability and Transit Services: Assists with nutrition, care and transit needs. 336-349-2343  Cancer Center Support Programs:   > Cancer Support Group  2nd Tuesday of the month 1pm-2pm, Journey Room   > Creative Journey  3rd Tuesday of the month 1130am-1pm, Journey Room    

## 2017-12-26 ENCOUNTER — Other Ambulatory Visit (HOSPITAL_COMMUNITY): Payer: Self-pay

## 2017-12-26 DIAGNOSIS — D508 Other iron deficiency anemias: Secondary | ICD-10-CM

## 2017-12-26 DIAGNOSIS — D693 Immune thrombocytopenic purpura: Secondary | ICD-10-CM

## 2017-12-26 DIAGNOSIS — C9000 Multiple myeloma not having achieved remission: Secondary | ICD-10-CM

## 2017-12-27 ENCOUNTER — Inpatient Hospital Stay (HOSPITAL_COMMUNITY): Payer: BLUE CROSS/BLUE SHIELD

## 2017-12-27 ENCOUNTER — Encounter (HOSPITAL_COMMUNITY): Payer: Self-pay

## 2017-12-27 ENCOUNTER — Other Ambulatory Visit: Payer: Self-pay

## 2017-12-27 ENCOUNTER — Encounter (HOSPITAL_COMMUNITY): Payer: Self-pay | Admitting: Hematology

## 2017-12-27 ENCOUNTER — Ambulatory Visit (HOSPITAL_COMMUNITY)
Admission: RE | Admit: 2017-12-27 | Discharge: 2017-12-27 | Disposition: A | Discharge status: Home / Self Care | Payer: BLUE CROSS/BLUE SHIELD | Source: Ambulatory Visit | Attending: Hematology | Admitting: Hematology

## 2017-12-27 VITALS — BP 127/84 | HR 101 | Temp 98.0°F | Resp 18 | Wt 387.0 lb

## 2017-12-27 DIAGNOSIS — R0609 Other forms of dyspnea: Secondary | ICD-10-CM | POA: Insufficient documentation

## 2017-12-27 DIAGNOSIS — D693 Immune thrombocytopenic purpura: Secondary | ICD-10-CM | POA: Diagnosis not present

## 2017-12-27 DIAGNOSIS — C9000 Multiple myeloma not having achieved remission: Secondary | ICD-10-CM

## 2017-12-27 DIAGNOSIS — D508 Other iron deficiency anemias: Secondary | ICD-10-CM

## 2017-12-27 LAB — COMPREHENSIVE METABOLIC PANEL WITH GFR
ALT: 159 U/L — ABNORMAL HIGH (ref 14–54)
AST: 96 U/L — ABNORMAL HIGH (ref 15–41)
Albumin: 3.4 g/dL — ABNORMAL LOW (ref 3.5–5.0)
Alkaline Phosphatase: 46 U/L (ref 38–126)
Anion gap: 13 (ref 5–15)
BUN: 13 mg/dL (ref 6–20)
CO2: 22 mmol/L (ref 22–32)
Calcium: 9 mg/dL (ref 8.9–10.3)
Chloride: 102 mmol/L (ref 101–111)
Creatinine, Ser: 0.91 mg/dL (ref 0.44–1.00)
GFR calc Af Amer: >60 mL/min (ref >60)
GFR calc non Af Amer: >60 mL/min (ref >60)
Glucose, Bld: 115 mg/dL — ABNORMAL HIGH (ref 65–99)
Potassium: 3.5 mmol/L (ref 3.5–5.1)
Sodium: 137 mmol/L (ref 135–145)
Total Bilirubin: 1.1 mg/dL (ref 0.3–1.2)
Total Protein: 7.1 g/dL (ref 6.5–8.1)

## 2017-12-27 LAB — CBC WITH DIFFERENTIAL/PLATELET
BASOS ABS: 0 10*3/uL (ref 0.0–0.1)
BASOS PCT: 0 %
Eosinophils Absolute: 0.1 10*3/uL (ref 0.0–0.7)
Eosinophils Relative: 0 %
HEMATOCRIT: 43.2 % (ref 36.0–46.0)
Hemoglobin: 13.7 g/dL (ref 12.0–15.0)
LYMPHS PCT: 17 %
Lymphs Abs: 2.9 10*3/uL (ref 0.7–4.0)
MCH: 28.9 pg (ref 26.0–34.0)
MCHC: 31.7 g/dL (ref 30.0–36.0)
MCV: 91.1 fL (ref 78.0–100.0)
MONO ABS: 1.4 10*3/uL — AB (ref 0.1–1.0)
Monocytes Relative: 8 %
NEUTROS ABS: 12.8 10*3/uL — AB (ref 1.7–7.7)
NEUTROS PCT: 75 %
Platelets: 175 10*3/uL (ref 150–400)
RBC: 4.74 MIL/uL (ref 3.87–5.11)
RDW: 15.5 % (ref 11.5–15.5)
WBC: 17.2 10*3/uL — AB (ref 4.0–10.5)

## 2017-12-27 MED ORDER — SODIUM CHLORIDE 0.9 % IV SOLN
Freq: Once | INTRAVENOUS | Status: AC
  Administered 2017-12-27: 10:00:00 via INTRAVENOUS

## 2017-12-27 MED ORDER — DIPHENHYDRAMINE HCL 50 MG/ML IJ SOLN
50.0000 mg | Freq: Once | INTRAMUSCULAR | Status: AC
  Administered 2017-12-27: 50 mg via INTRAVENOUS
  Filled 2017-12-27: qty 1

## 2017-12-27 MED ORDER — SODIUM CHLORIDE 0.9 % IV SOLN
375.0000 mg/m2 | Freq: Once | INTRAVENOUS | Status: AC
  Administered 2017-12-27: 1100 mg via INTRAVENOUS
  Filled 2017-12-27: qty 10

## 2017-12-27 MED ORDER — ACETAMINOPHEN 325 MG PO TABS
650.0000 mg | ORAL_TABLET | Freq: Once | ORAL | Status: AC
  Administered 2017-12-27: 650 mg via ORAL

## 2017-12-27 MED ORDER — DIPHENHYDRAMINE HCL 25 MG PO CAPS
ORAL_CAPSULE | ORAL | Status: AC
  Filled 2017-12-27: qty 2

## 2017-12-27 MED ORDER — ACETAMINOPHEN 325 MG PO TABS
ORAL_TABLET | ORAL | Status: AC
  Filled 2017-12-27: qty 2

## 2017-12-27 MED ORDER — FAMOTIDINE IN NACL 20-0.9 MG/50ML-% IV SOLN
INTRAVENOUS | Status: AC
  Filled 2017-12-27: qty 50

## 2017-12-27 MED ORDER — FAMOTIDINE IN NACL 20-0.9 MG/50ML-% IV SOLN
20.0000 mg | Freq: Two times a day (BID) | INTRAVENOUS | Status: DC
  Administered 2017-12-27: 20 mg via INTRAVENOUS

## 2017-12-27 NOTE — Progress Notes (Signed)
Pt reports worsening SOB since last tx.  States she has always had some dyspnea with exertion, but it is worse since last week.  Reports dyspnea with minimal exertion, a fullness feeling in her throat, a non-productive cough, and feeling of congestion in her upper chest.  Dr. Delton Coombes notified of pt s/s - okay to tx with Rituxan today per MD.  Advised pt per MD to take OTC medications for cough/congestion. CXR also ordered, and pt will go for that exam after Rituxan infusion complete. She verbalizes understanding.   Tolerated infusion w/o adverse reaction.  Alert, in no distress.  VSS.  Discharged ambulatory to radiology for CXR.

## 2018-01-03 ENCOUNTER — Inpatient Hospital Stay (HOSPITAL_COMMUNITY): Payer: BLUE CROSS/BLUE SHIELD

## 2018-01-03 ENCOUNTER — Encounter (HOSPITAL_COMMUNITY): Payer: Self-pay | Admitting: Hematology

## 2018-01-03 ENCOUNTER — Inpatient Hospital Stay (HOSPITAL_BASED_OUTPATIENT_CLINIC_OR_DEPARTMENT_OTHER): Payer: BLUE CROSS/BLUE SHIELD | Admitting: Hematology

## 2018-01-03 ENCOUNTER — Other Ambulatory Visit: Payer: Self-pay

## 2018-01-03 ENCOUNTER — Other Ambulatory Visit (HOSPITAL_COMMUNITY): Payer: BLUE CROSS/BLUE SHIELD

## 2018-01-03 ENCOUNTER — Ambulatory Visit (HOSPITAL_COMMUNITY): Payer: BLUE CROSS/BLUE SHIELD

## 2018-01-03 VITALS — BP 118/81 | HR 94 | Temp 98.1°F | Resp 20 | Wt 390.6 lb

## 2018-01-03 DIAGNOSIS — R05 Cough: Secondary | ICD-10-CM | POA: Diagnosis not present

## 2018-01-03 DIAGNOSIS — Z87891 Personal history of nicotine dependence: Secondary | ICD-10-CM

## 2018-01-03 DIAGNOSIS — D693 Immune thrombocytopenic purpura: Secondary | ICD-10-CM

## 2018-01-03 DIAGNOSIS — Z79899 Other long term (current) drug therapy: Secondary | ICD-10-CM | POA: Diagnosis not present

## 2018-01-03 DIAGNOSIS — C9000 Multiple myeloma not having achieved remission: Secondary | ICD-10-CM

## 2018-01-03 DIAGNOSIS — R5383 Other fatigue: Secondary | ICD-10-CM | POA: Diagnosis not present

## 2018-01-03 DIAGNOSIS — R093 Abnormal sputum: Secondary | ICD-10-CM | POA: Diagnosis not present

## 2018-01-03 DIAGNOSIS — Z7952 Long term (current) use of systemic steroids: Secondary | ICD-10-CM

## 2018-01-03 DIAGNOSIS — D508 Other iron deficiency anemias: Secondary | ICD-10-CM

## 2018-01-03 DIAGNOSIS — R0609 Other forms of dyspnea: Secondary | ICD-10-CM

## 2018-01-03 DIAGNOSIS — R600 Localized edema: Secondary | ICD-10-CM

## 2018-01-03 LAB — COMPREHENSIVE METABOLIC PANEL
ALBUMIN: 3.4 g/dL — AB (ref 3.5–5.0)
ALT: 78 U/L — AB (ref 14–54)
AST: 41 U/L (ref 15–41)
Alkaline Phosphatase: 40 U/L (ref 38–126)
Anion gap: 11 (ref 5–15)
BILIRUBIN TOTAL: 1.2 mg/dL (ref 0.3–1.2)
BUN: 22 mg/dL — AB (ref 6–20)
CO2: 27 mmol/L (ref 22–32)
CREATININE: 1.03 mg/dL — AB (ref 0.44–1.00)
Calcium: 9.1 mg/dL (ref 8.9–10.3)
Chloride: 100 mmol/L — ABNORMAL LOW (ref 101–111)
GFR calc Af Amer: >60 mL/min (ref >60)
GFR calc non Af Amer: >60 mL/min (ref >60)
GLUCOSE: 111 mg/dL — AB (ref 65–99)
POTASSIUM: 3.8 mmol/L (ref 3.5–5.1)
Sodium: 138 mmol/L (ref 135–145)
TOTAL PROTEIN: 7 g/dL (ref 6.5–8.1)

## 2018-01-03 LAB — CBC WITH DIFFERENTIAL/PLATELET
BASOS ABS: 0 10*3/uL (ref 0.0–0.1)
BASOS PCT: 0 %
Eosinophils Absolute: 0.1 10*3/uL (ref 0.0–0.7)
Eosinophils Relative: 1 %
HEMATOCRIT: 45.7 % (ref 36.0–46.0)
HEMOGLOBIN: 14.4 g/dL (ref 12.0–15.0)
Lymphocytes Relative: 20 %
Lymphs Abs: 3.4 10*3/uL (ref 0.7–4.0)
MCH: 29.1 pg (ref 26.0–34.0)
MCHC: 31.5 g/dL (ref 30.0–36.0)
MCV: 92.3 fL (ref 78.0–100.0)
MONO ABS: 1.6 10*3/uL — AB (ref 0.1–1.0)
Monocytes Relative: 9 %
NEUTROS PCT: 70 %
Neutro Abs: 12.4 10*3/uL — ABNORMAL HIGH (ref 1.7–7.7)
Platelets: 214 10*3/uL (ref 150–400)
RBC: 4.95 MIL/uL (ref 3.87–5.11)
RDW: 14.7 % (ref 11.5–15.5)
WBC: 17.6 10*3/uL — AB (ref 4.0–10.5)

## 2018-01-03 MED ORDER — SODIUM CHLORIDE 0.9 % IV SOLN
Freq: Once | INTRAVENOUS | Status: AC
  Administered 2018-01-03: 09:00:00 via INTRAVENOUS

## 2018-01-03 MED ORDER — ACETAMINOPHEN 325 MG PO TABS
650.0000 mg | ORAL_TABLET | Freq: Once | ORAL | Status: AC
  Administered 2018-01-03: 650 mg via ORAL

## 2018-01-03 MED ORDER — FAMOTIDINE IN NACL 20-0.9 MG/50ML-% IV SOLN
INTRAVENOUS | Status: AC
  Filled 2018-01-03: qty 50

## 2018-01-03 MED ORDER — FAMOTIDINE IN NACL 20-0.9 MG/50ML-% IV SOLN
20.0000 mg | Freq: Two times a day (BID) | INTRAVENOUS | Status: DC
  Administered 2018-01-03: 20 mg via INTRAVENOUS

## 2018-01-03 MED ORDER — SODIUM CHLORIDE 0.9 % IV SOLN
375.0000 mg/m2 | Freq: Once | INTRAVENOUS | Status: AC
  Administered 2018-01-03: 1100 mg via INTRAVENOUS
  Filled 2018-01-03: qty 110

## 2018-01-03 MED ORDER — DIPHENHYDRAMINE HCL 50 MG/ML IJ SOLN
INTRAMUSCULAR | Status: AC
  Filled 2018-01-03: qty 1

## 2018-01-03 MED ORDER — DIPHENHYDRAMINE HCL 50 MG/ML IJ SOLN
50.0000 mg | Freq: Once | INTRAMUSCULAR | Status: AC
  Administered 2018-01-03: 50 mg via INTRAVENOUS

## 2018-01-03 MED ORDER — ACETAMINOPHEN 325 MG PO TABS
ORAL_TABLET | ORAL | Status: AC
  Filled 2018-01-03: qty 2

## 2018-01-03 NOTE — Assessment & Plan Note (Addendum)
1.  Immune thrombocytopenia: - Admitted to [facility] on 11/19/2017 with easy bruising and bleeding, platelet count 0, status post IVIG 400 mg/kg/day for 5 days, and prednisone 175 mg daily, decrease 200 mg daily at discharge -CT scan of the neck, chest, abdomen and pelvis on 11/20/2017 did not reveal any masses or adenopathy or splenomegaly, peripheral smear confirmed from cytopenia with no platelet counts - Platelet count started falling when patient on prednisone 80 mg daily to 31,000. - Weekly rituximab started on 12/13/2017, today platelet count is 214.  She will proceed with week 4 of rituximab.  She will cut back prednisone to 40 mg daily.  We will reassess her in 2 weeks with labs.  She was told to further cut back on prednisone to 20 mg daily next week.  We plan to do taper of prednisone completely as it did not help maintain her platelet count. - Chest x-ray from last visit did not show any cardiopulmonary disease.  Patient reports that her shortness of breath on exertion has improved.  Mucinex has helped bring up clear whitish sputum.

## 2018-01-03 NOTE — Progress Notes (Signed)
Tracey Maldonado, [place] [address]   CLINIC:  Medical Oncology/Hematology  PCP:  Emelda Fear, DO [address] [place] [address] [phone]   REASON FOR VISIT:  Follow-up for immune thrombocytopenia.  CURRENT THERAPY: Prednisone 80 mg daily. + weekly Rituxan beginning on 12/13/17    INTERVAL HISTORY:  [name] 42 y.o. female returns for follow-up of her thrombocytopenia.    Received first dose of Rituxan on 12/13/17.  She is due for cycle #4 today. Planned for 4 cycles of Rituxan.   She is taking prednisone 60 mg daily.  She reports leg swellings.  Shortness of breath at last visit has improved.  We have done a chest x-ray last week which was clear.  She is having very rare cough with whitish expectoration.  She denies any fevers or chills.  She is continuing to work full-time job.   REVIEW OF SYSTEMS:  Review of Systems  Respiratory: Positive for cough.   Cardiovascular: Positive for leg swelling.  All other systems reviewed and are negative.    PAST MEDICAL/SURGICAL HISTORY:  History reviewed. No pertinent past medical history. Past Surgical History:  Procedure Laterality Date  . APPENDECTOMY  1984     SOCIAL HISTORY:  Social History   Socioeconomic History  . Marital status: Widowed    Spouse name: Not on file  . Number of children: Not on file  . Years of education: Not on file  . Highest education level: Not on file  Occupational History  . Not on file  Social Needs  . Financial resource strain: Not on file  . Food insecurity:    Worry: Not on file    Inability: Not on file  . Transportation needs:    Medical: Not on file    Non-medical: Not on file  Tobacco Use  . Smoking status: Former Smoker    Packs/day: 0.25    Years: 5.00    Pack years: 1.25    Types: Cigarettes    Last attempt to quit: 11/26/2001    Years since quitting: 16.1  . Smokeless tobacco: Never Used  Substance and Sexual Activity  .  Alcohol use: Yes    Frequency: Never    Comment: Occassionally  . Drug use: Never  . Sexual activity: Not on file  Lifestyle  . Physical activity:    Days per week: Not on file    Minutes per session: Not on file  . Stress: Not on file  Relationships  . Social connections:    Talks on phone: Not on file    Gets together: Not on file    Attends religious service: Not on file    Active member of club or organization: Not on file    Attends meetings of clubs or organizations: Not on file    Relationship status: Not on file  . Intimate partner violence:    Fear of current or ex partner: Not on file    Emotionally abused: Not on file    Physically abused: Not on file    Forced sexual activity: Not on file  Other Topics Concern  . Not on file  Social History Narrative  . Not on file    FAMILY HISTORY:  Family History  Problem Relation Age of Onset  . Hypertension Mother   . Diabetes Paternal Grandmother     CURRENT MEDICATIONS:  Outpatient Encounter Medications as of 01/03/2018  Medication Sig  . [employer] PAIN RELIEF REGULAR ST  325 MG tablet   . ESTARYLLA 0.25-35 MG-MCG tablet   . famotidine (PEPCID) 20 MG tablet   . predniSONE (DELTASONE) 20 MG tablet Take 60 mg by mouth.   No facility-administered encounter medications on file as of 01/03/2018.     ALLERGIES:  No Known Allergies   PHYSICAL EXAM:  ECOG Performance status: 0  I have reviewed her vital signs today. Physical ExamNo bruising or petechiae. Chest is bilaterally clear to auscultation.  LABORATORY DATA:  I have reviewed the labs as listed.  CBC    Component Value Date/Time   WBC 17.6 (H) 01/03/2018 0811   RBC 4.95 01/03/2018 0811   HGB 14.4 01/03/2018 0811   HCT 45.7 01/03/2018 0811   PLT 214 01/03/2018 0811   MCV 92.3 01/03/2018 0811   MCH 29.1 01/03/2018 0811   MCHC 31.5 01/03/2018 0811   RDW 14.7 01/03/2018 0811   LYMPHSABS 3.4 01/03/2018 0811   MONOABS 1.6 (H) 01/03/2018 0811   EOSABS 0.1  01/03/2018 0811   BASOSABS 0.0 01/03/2018 0811   CMP Latest Ref Rng & Units 01/03/2018 12/27/2017 12/20/2017  Glucose 65 - 99 mg/dL 111(H) 115(H) 124(H)  BUN 6 - 20 mg/dL 22(H) 13 18  Creatinine 0.44 - 1.00 mg/dL 1.03(H) 0.91 0.93  Sodium 135 - 145 mmol/L 138 137 139  Potassium 3.5 - 5.1 mmol/L 3.8 3.5 3.7  Chloride 101 - 111 mmol/L 100(L) 102 102  CO2 22 - 32 mmol/L 27 22 25   Calcium 8.9 - 10.3 mg/dL 9.1 9.0 8.9  Total Protein 6.5 - 8.1 g/dL 7.0 7.1 7.1  Total Bilirubin 0.3 - 1.2 mg/dL 1.2 1.1 0.9  Alkaline Phos 38 - 126 U/L 40 46 43  AST 15 - 41 U/L 41 96(H) 28  ALT 14 - 54 U/L 78(H) 159(H) 31       ASSESSMENT & PLAN:   Idiopathic thrombocytopenic purpura (ITP) (HCC) 1.  Immune thrombocytopenia: - Admitted to [facility] on 11/19/2017 with easy bruising and bleeding, platelet count 0, status post IVIG 400 mg/kg/day for 5 days, and prednisone 175 mg daily, decrease 200 mg daily at discharge -CT scan of the neck, chest, abdomen and pelvis on 11/20/2017 did not reveal any masses or adenopathy or splenomegaly, peripheral smear confirmed from cytopenia with no platelet counts - Platelet count started falling when patient on prednisone 80 mg daily to 31,000. - Weekly rituximab started on 12/13/2017, today platelet count is 214.  She will proceed with week 4 of rituximab.  She will cut back prednisone to 40 mg daily.  We will reassess her in 2 weeks with labs.  She was told to further cut back on prednisone to 20 mg daily next week.  We plan to do taper of prednisone completely as it did not help maintain her platelet count. - Chest x-ray from last visit did not show any cardiopulmonary disease.  Patient reports that her shortness of breath on exertion has improved.  Mucinex has helped bring up clear whitish sputum.    This note includes documentation from Mike Craze, NP, who was present during this patient's office visit and evaluation.  I have reviewed this note for its  completeness and accuracy.  I have edited this note accordingly based on my findings and medical opinion.       Derek Jack, MD [place] [phone]

## 2018-01-03 NOTE — Patient Instructions (Signed)
[facility] at [facility] Discharge Instructions  You saw Dr. Katragadda today.   Thank you for choosing  Cancer Center at [facility] to provide your oncology and hematology care.  To afford each patient quality time with our provider, please arrive at least 15 minutes before your scheduled appointment time.   If you have a lab appointment with the Cancer Center please come in thru the  Main Entrance and check in at the main information desk  You need to re-schedule your appointment should you arrive 10 or more minutes late.  We strive to give you quality time with our providers, and arriving late affects you and other patients whose appointments are after yours.  Also, if you no show three or more times for appointments you may be dismissed from the clinic at the providers discretion.     Again, thank you for choosing [facility].  Our hope is that these requests will decrease the amount of time that you wait before being seen by our physicians.       _____________________________________________________________  Should you have questions after your visit to  Cancer Center, please contact our office at [phone] between the hours of 8:30 a.m. and 4:30 p.m.  Voicemails left after 4:30 p.m. will not be returned until the following business day.  For prescription refill requests, have your pharmacy contact our office.       Resources For Cancer Patients and their Caregivers ? American Cancer Society: Can assist with transportation, wigs, general needs, runs Look Good Feel Better.        1-888-227-6333 ? Cancer Care: Provides financial assistance, online support groups, medication/co-pay assistance.  1-800-813-HOPE (4673) ? [facility] Assists [place] cancer patients and their families through emotional , educational and financial support.  336-427-4357 ? [place] DSS Where to apply for  food stamps, Medicaid and utility assistance. 336-342-1394 ? [facility]: Transportation to medical appointments. 336-347-2287 ? Social Security Administration: May apply for disability if have a Stage IV cancer. 336-342-7796 1-800-772-1213 ? [place] Aging, Disability and Transit Services: Assists with nutrition, care and transit needs. 336-349-2343  Cancer Center Support Programs:   > Cancer Support Group  2nd Tuesday of the month 1pm-2pm, Journey Room   > Creative Journey  3rd Tuesday of the month 1130am-1pm, Journey Room     

## 2018-01-03 NOTE — Progress Notes (Signed)
Tolerated infusion w/o adverse reaction.  Alert, in no distress.  VSS.  Discharged ambulatory.  

## 2018-01-15 ENCOUNTER — Inpatient Hospital Stay (HOSPITAL_COMMUNITY): Payer: BLUE CROSS/BLUE SHIELD

## 2018-01-15 DIAGNOSIS — D693 Immune thrombocytopenic purpura: Secondary | ICD-10-CM

## 2018-01-15 LAB — CBC WITH DIFFERENTIAL/PLATELET
BASOS PCT: 0 %
Basophils Absolute: 0 10*3/uL (ref 0.0–0.1)
EOS ABS: 0 10*3/uL (ref 0.0–0.7)
EOS PCT: 0 %
HCT: 47.1 % — ABNORMAL HIGH (ref 36.0–46.0)
Hemoglobin: 15.5 g/dL — ABNORMAL HIGH (ref 12.0–15.0)
Lymphocytes Relative: 6 %
Lymphs Abs: 1.1 10*3/uL (ref 0.7–4.0)
MCH: 29.6 pg (ref 26.0–34.0)
MCHC: 32.9 g/dL (ref 30.0–36.0)
MCV: 90.1 fL (ref 78.0–100.0)
MONO ABS: 0.6 10*3/uL (ref 0.1–1.0)
Monocytes Relative: 3 %
Neutro Abs: 17 10*3/uL — ABNORMAL HIGH (ref 1.7–7.7)
Neutrophils Relative %: 91 %
PLATELETS: 302 10*3/uL (ref 150–400)
RBC: 5.23 MIL/uL — ABNORMAL HIGH (ref 3.87–5.11)
RDW: 13.8 % (ref 11.5–15.5)
WBC: 18.7 10*3/uL — ABNORMAL HIGH (ref 4.0–10.5)

## 2018-01-15 LAB — COMPREHENSIVE METABOLIC PANEL
ALBUMIN: 3.7 g/dL (ref 3.5–5.0)
ALK PHOS: 48 U/L (ref 38–126)
ALT: 31 U/L (ref 14–54)
ANION GAP: 10 (ref 5–15)
AST: 25 U/L (ref 15–41)
BUN: 13 mg/dL (ref 6–20)
CALCIUM: 9.4 mg/dL (ref 8.9–10.3)
CHLORIDE: 99 mmol/L — AB (ref 101–111)
CO2: 25 mmol/L (ref 22–32)
Creatinine, Ser: 0.86 mg/dL (ref 0.44–1.00)
GFR calc non Af Amer: >60 mL/min (ref >60)
Glucose, Bld: 118 mg/dL — ABNORMAL HIGH (ref 65–99)
POTASSIUM: 4.2 mmol/L (ref 3.5–5.1)
SODIUM: 134 mmol/L — AB (ref 135–145)
Total Bilirubin: 0.8 mg/dL (ref 0.3–1.2)
Total Protein: 8.2 g/dL — ABNORMAL HIGH (ref 6.5–8.1)

## 2018-01-17 ENCOUNTER — Encounter (HOSPITAL_COMMUNITY): Payer: Self-pay | Admitting: Hematology

## 2018-01-17 ENCOUNTER — Inpatient Hospital Stay (HOSPITAL_BASED_OUTPATIENT_CLINIC_OR_DEPARTMENT_OTHER): Payer: BLUE CROSS/BLUE SHIELD | Admitting: Hematology

## 2018-01-17 ENCOUNTER — Other Ambulatory Visit: Payer: Self-pay

## 2018-01-17 VITALS — BP 150/86 | HR 106 | Temp 98.5°F | Resp 20 | Wt 387.3 lb

## 2018-01-17 DIAGNOSIS — Z7952 Long term (current) use of systemic steroids: Secondary | ICD-10-CM

## 2018-01-17 DIAGNOSIS — Z79899 Other long term (current) drug therapy: Secondary | ICD-10-CM

## 2018-01-17 DIAGNOSIS — Z1231 Encounter for screening mammogram for malignant neoplasm of breast: Secondary | ICD-10-CM

## 2018-01-17 DIAGNOSIS — D693 Immune thrombocytopenic purpura: Secondary | ICD-10-CM | POA: Diagnosis not present

## 2018-01-17 DIAGNOSIS — Z87891 Personal history of nicotine dependence: Secondary | ICD-10-CM

## 2018-01-17 DIAGNOSIS — R05 Cough: Secondary | ICD-10-CM

## 2018-01-17 DIAGNOSIS — R093 Abnormal sputum: Secondary | ICD-10-CM

## 2018-01-17 DIAGNOSIS — R059 Cough, unspecified: Secondary | ICD-10-CM

## 2018-01-17 MED ORDER — BENZONATATE 100 MG PO CAPS: 100.0000 mg | ORAL_CAPSULE | Freq: Three times a day (TID) | ORAL | 0 refills | Status: DC | PRN

## 2018-01-17 NOTE — Patient Instructions (Signed)
[facility] at [facility] Discharge Instructions  Today you saw Dr. K.   Thank you for choosing [facility] at [facility] to provide your oncology and hematology care.  To afford each patient quality time with our provider, please arrive at least 15 minutes before your scheduled appointment time.   If you have a lab appointment with the Cancer Center please come in thru the  Main Entrance and check in at the main information desk  You need to re-schedule your appointment should you arrive 10 or more minutes late.  We strive to give you quality time with our providers, and arriving late affects you and other patients whose appointments are after yours.  Also, if you no show three or more times for appointments you may be dismissed from the clinic at the providers discretion.     Again, thank you for choosing [facility].  Our hope is that these requests will decrease the amount of time that you wait before being seen by our physicians.       _____________________________________________________________  Should you have questions after your visit to [facility], please contact our office at [phone] between the hours of 8:30 a.m. and 4:30 p.m.  Voicemails left after 4:30 p.m. will not be returned until the following business day.  For prescription refill requests, have your pharmacy contact our office.       Resources For Cancer Patients and their Caregivers ? American Cancer Society: Can assist with transportation, wigs, general needs, runs Look Good Feel Better.        1-888-227-6333 ? Cancer Care: Provides financial assistance, online support groups, medication/co-pay assistance.  1-800-813-HOPE (4673) ? [facility] Assists [place] cancer patients and their families through emotional , educational and financial support.  336-427-4357 ? [place] DSS Where to apply for food  stamps, Medicaid and utility assistance. 336-342-1394 ? [facility]: Transportation to medical appointments. 336-347-2287 ? Social Security Administration: May apply for disability if have a Stage IV cancer. 336-342-7796 1-800-772-1213 ? [place] Aging, Disability and Transit Services: Assists with nutrition, care and transit needs. 336-349-2343  Cancer Center Support Programs:   > Cancer Support Group  2nd Tuesday of the month 1pm-2pm, Journey Room   > Creative Journey  3rd Tuesday of the month 1130am-1pm, Journey Room    

## 2018-01-17 NOTE — Assessment & Plan Note (Addendum)
1.  Immune thrombocytopenia: - Admitted to [facility] on 11/19/2017 with easy bruising and bleeding, platelet count 0, status post IVIG 400 mg/kg/day for 5 days, and prednisone 175 mg daily, decrease 200 mg daily at discharge -CT scan of the neck, chest, abdomen and pelvis on 11/20/2017 did not reveal any masses or adenopathy or splenomegaly, peripheral smear confirmed from cytopenia with no platelet counts - Platelet count started falling when patient on prednisone 80 mg daily to 31,000. - Weekly rituximab x4 from 12/13/2017 through 01/03/2018 with improvement in platelet count.  Platelet count today is 302. - She is currently taking prednisone 20 mg.  I have told her to cut it down to 10 mg for the next 5 days, followed by 5 mg for 5 days, followed by 5 mg every other day for 5 to 10 days. -I will see her back in 1 month for follow-up.  We will repeat blood counts at that time.  2.  Dry cough: -She has developed dry cough after second weekly infusion of rituximab.  We have done a chest x-ray which did not show any cardiopulmonary disease. -Patient showed improvement in the cough after she started taking Mucinex and a cough syrup.  She still has some lingering cough.  I have given a prescription for Tessalon Perles 100 mg 3 times a day for 7 to 10 days.  Patient reported similar episode of cough which lingered for couple of months 2 years ago.  I have also suggested her to take antihistamine.  3.  Health maintenance: - Patient would like to have her screening mammogram done at our facility.  I have ordered screening 3D mammogram bilateral breast.

## 2018-01-17 NOTE — Progress Notes (Signed)
[place], [place] [address]   CLINIC:  Medical Oncology/Hematology  PCP:  Emelda Fear, DO [address] [place] [address] [phone]   REASON FOR VISIT:  Follow-up for immune thrombocytopenia.  CURRENT THERAPY: Status post 4 weekly infusions of rituximab completed on 01/03/2018.   INTERVAL HISTORY:  Ms. Burklow 43 y.o. female returns for follow-up visit.  She denies any bleeding or easy bruising.  She is continuing to work full-time job.  She is currently taking prednisone 20 mg daily.  She reports that her cough is slightly better.  However she is still continues to have cough.  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  She is currently taking Mucinex and daytime cough syrup.  She denies any significant sputum.  She recalls that she had lingering cough which lasted couple of months 2 years ago.  REVIEW OF SYSTEMS:  Review of Systems  Constitutional: Negative.   HENT:  Negative.   Eyes: Negative.   Respiratory: Positive for cough.   Cardiovascular: Negative.   Gastrointestinal: Negative.   Genitourinary: Negative.    Musculoskeletal: Negative.   Skin: Negative.   Neurological: Negative.   Hematological: Negative.   Psychiatric/Behavioral: Negative.      PAST MEDICAL/SURGICAL HISTORY:  History reviewed. No pertinent past medical history. Past Surgical History:  Procedure Laterality Date  . APPENDECTOMY  1984     SOCIAL HISTORY:  Social History   Socioeconomic History  . Marital status: Widowed    Spouse name: Not on file  . Number of children: Not on file  . Years of education: Not on file  . Highest education level: Not on file  Occupational History  . Not on file  Social Needs  . Financial resource strain: Not on file  . Food insecurity:    Worry: Not on file    Inability: Not on file  . Transportation needs:    Medical: Not on file    Non-medical: Not on file  Tobacco Use   . Smoking status: Former Smoker    Packs/day: 0.25    Years: 5.00    Pack years: 1.25    Types: Cigarettes    Last attempt to quit: 11/26/2001    Years since quitting: 16.1  . Smokeless tobacco: Never Used  Substance and Sexual Activity  . Alcohol use: Yes    Frequency: Never    Comment: Occassionally  . Drug use: Never  . Sexual activity: Not on file  Lifestyle  . Physical activity:    Days per week: Not on file    Minutes per session: Not on file  . Stress: Not on file  Relationships  . Social connections:    Talks on phone: Not on file    Gets together: Not on file    Attends religious service: Not on file    Active member of club or organization: Not on file    Attends meetings of clubs or organizations: Not on file    Relationship status: Not on file  . Intimate partner violence:    Fear of current or ex partner: Not on file    Emotionally abused: Not on file    Physically abused: Not on file    Forced sexual activity: Not on file  Other Topics Concern  . Not on file  Social History Narrative  . Not on file    FAMILY HISTORY:  Family History  Problem Relation Age  of Onset  . Hypertension Mother   . Diabetes Paternal Grandmother     CURRENT MEDICATIONS:  Outpatient Encounter Medications as of 01/17/2018  Medication Sig  . [employer] PAIN RELIEF REGULAR ST 325 MG tablet   . ESTARYLLA 0.25-35 MG-MCG tablet   . famotidine (PEPCID) 20 MG tablet   . predniSONE (DELTASONE) 20 MG tablet Take 60 mg by mouth.  . benzonatate (TESSALON) 100 MG capsule Take 1 capsule (100 mg total) by mouth 3 (three) times daily as needed for cough.   No facility-administered encounter medications on file as of 01/17/2018.     ALLERGIES:  No Known Allergies   PHYSICAL EXAM:  ECOG Performance status: 0  Vitals:   01/17/18 0909  BP: (!) 150/86  Pulse: (!) 106  Resp: 20  Temp: 98.5 F (36.9 C)  SpO2: 97%   Filed Weights   01/17/18 0909  Weight: (!) 387 lb 4.8 oz (175.7 kg)     Physical Exam Lungs are bilaterally clear to auscultation.  LABORATORY DATA:  I have reviewed the labs as listed.  CBC    Component Value Date/Time   WBC 18.7 (H) 01/15/2018 1219   RBC 5.23 (H) 01/15/2018 1219   HGB 15.5 (H) 01/15/2018 1219   HCT 47.1 (H) 01/15/2018 1219   PLT 302 01/15/2018 1219   MCV 90.1 01/15/2018 1219   MCH 29.6 01/15/2018 1219   MCHC 32.9 01/15/2018 1219   RDW 13.8 01/15/2018 1219   LYMPHSABS 1.1 01/15/2018 1219   MONOABS 0.6 01/15/2018 1219   EOSABS 0.0 01/15/2018 1219   BASOSABS 0.0 01/15/2018 1219   CMP Latest Ref Rng & Units 01/15/2018 01/03/2018 12/27/2017  Glucose 65 - 99 mg/dL 118(H) 111(H) 115(H)  BUN 6 - 20 mg/dL 13 22(H) 13  Creatinine 0.44 - 1.00 mg/dL 0.86 1.03(H) 0.91  Sodium 135 - 145 mmol/L 134(L) 138 137  Potassium 3.5 - 5.1 mmol/L 4.2 3.8 3.5  Chloride 101 - 111 mmol/L 99(L) 100(L) 102  CO2 22 - 32 mmol/L 25 27 22   Calcium 8.9 - 10.3 mg/dL 9.4 9.1 9.0  Total Protein 6.5 - 8.1 g/dL 8.2(H) 7.0 7.1  Total Bilirubin 0.3 - 1.2 mg/dL 0.8 1.2 1.1  Alkaline Phos 38 - 126 U/L 48 40 46  AST 15 - 41 U/L 25 41 96(H)  ALT 14 - 54 U/L 31 78(H) 159(H)        ASSESSMENT & PLAN:   Idiopathic thrombocytopenic purpura (ITP) (HCC) 1.  Immune thrombocytopenia: - Admitted to [facility] on 11/19/2017 with easy bruising and bleeding, platelet count 0, status post IVIG 400 mg/kg/day for 5 days, and prednisone 175 mg daily, decrease 200 mg daily at discharge -CT scan of the neck, chest, abdomen and pelvis on 11/20/2017 did not reveal any masses or adenopathy or splenomegaly, peripheral smear confirmed from cytopenia with no platelet counts - Platelet count started falling when patient on prednisone 80 mg daily to 31,000. - Weekly rituximab x4 from 12/13/2017 through 01/03/2018 with improvement in platelet count.  Platelet count today is 302. - She is currently taking prednisone 20 mg.  I have told her to cut it down to 10 mg for the next 5  days, followed by 5 mg for 5 days, followed by 5 mg every other day for 5 to 10 days. -I will see her back in 1 month for follow-up.  We will repeat blood counts at that time.  2.  Dry cough: -She has developed dry cough after second weekly  infusion of rituximab.  We have done a chest x-ray which did not show any cardiopulmonary disease. -Patient showed improvement in the cough after she started taking Mucinex and a cough syrup.  She still has some lingering cough.  I have given a prescription for Tessalon Perles 100 mg 3 times a day for 7 to 10 days.  Patient reported similar episode of cough which lingered for couple of months 2 years ago.  I have also suggested her to take antihistamine.  3.  Health maintenance: - Patient would like to have her screening mammogram done at our facility.  I have ordered screening 3D mammogram bilateral breast.       Orders placed this encounter:  Orders Placed This Encounter  Procedures  . MM DIAG BREAST TOMO BILATERAL      Derek Jack, MD [place] [phone]

## 2018-01-28 ENCOUNTER — Other Ambulatory Visit (HOSPITAL_COMMUNITY): Payer: Self-pay

## 2018-01-28 MED ORDER — CYCLOBENZAPRINE HCL 5 MG PO TABS: 5.0000 mg | ORAL_TABLET | Freq: Three times a day (TID) | ORAL | 0 refills | Status: DC

## 2018-02-14 ENCOUNTER — Encounter (HOSPITAL_COMMUNITY): Payer: Self-pay | Admitting: Radiology

## 2018-02-14 ENCOUNTER — Ambulatory Visit (HOSPITAL_COMMUNITY)
Admission: RE | Admit: 2018-02-14 | Discharge: 2018-02-14 | Disposition: A | Discharge status: Home / Self Care | Payer: BLUE CROSS/BLUE SHIELD | Source: Ambulatory Visit | Attending: Hematology | Admitting: Hematology

## 2018-02-14 DIAGNOSIS — Z1231 Encounter for screening mammogram for malignant neoplasm of breast: Secondary | ICD-10-CM | POA: Insufficient documentation

## 2018-02-18 ENCOUNTER — Inpatient Hospital Stay (HOSPITAL_COMMUNITY): Payer: BLUE CROSS/BLUE SHIELD | Attending: Hematology

## 2018-02-18 DIAGNOSIS — D693 Immune thrombocytopenic purpura: Secondary | ICD-10-CM | POA: Insufficient documentation

## 2018-02-18 DIAGNOSIS — M7989 Other specified soft tissue disorders: Secondary | ICD-10-CM | POA: Diagnosis not present

## 2018-02-18 DIAGNOSIS — Z87891 Personal history of nicotine dependence: Secondary | ICD-10-CM | POA: Insufficient documentation

## 2018-02-18 DIAGNOSIS — C9000 Multiple myeloma not having achieved remission: Secondary | ICD-10-CM

## 2018-02-18 DIAGNOSIS — Z79899 Other long term (current) drug therapy: Secondary | ICD-10-CM | POA: Insufficient documentation

## 2018-02-18 DIAGNOSIS — D508 Other iron deficiency anemias: Secondary | ICD-10-CM

## 2018-02-18 LAB — COMPREHENSIVE METABOLIC PANEL
ALBUMIN: 3.5 g/dL (ref 3.5–5.0)
ALK PHOS: 58 U/L (ref 38–126)
ALT: 20 U/L (ref 0–44)
AST: 21 U/L (ref 15–41)
Anion gap: 8 (ref 5–15)
BILIRUBIN TOTAL: 0.4 mg/dL (ref 0.3–1.2)
BUN: 14 mg/dL (ref 6–20)
CALCIUM: 9.4 mg/dL (ref 8.9–10.3)
CO2: 26 mmol/L (ref 22–32)
Chloride: 104 mmol/L (ref 98–111)
Creatinine, Ser: 0.77 mg/dL (ref 0.44–1.00)
GFR calc Af Amer: >60 mL/min (ref >60)
GFR calc non Af Amer: >60 mL/min (ref >60)
GLUCOSE: 71 mg/dL (ref 70–99)
Potassium: 4.2 mmol/L (ref 3.5–5.1)
Sodium: 138 mmol/L (ref 135–145)
TOTAL PROTEIN: 8.2 g/dL — AB (ref 6.5–8.1)

## 2018-02-18 LAB — CBC WITH DIFFERENTIAL/PLATELET
Basophils Absolute: 0.1 10*3/uL (ref 0.0–0.1)
Basophils Relative: 1 %
Eosinophils Absolute: 0.8 10*3/uL — ABNORMAL HIGH (ref 0.0–0.7)
Eosinophils Relative: 8 %
HCT: 44.5 % (ref 36.0–46.0)
Hemoglobin: 14.7 g/dL (ref 12.0–15.0)
Lymphocytes Relative: 34 %
Lymphs Abs: 3.4 10*3/uL (ref 0.7–4.0)
MCH: 28 pg (ref 26.0–34.0)
MCHC: 33 g/dL (ref 30.0–36.0)
MCV: 84.8 fL (ref 78.0–100.0)
MONO ABS: 1.3 10*3/uL — AB (ref 0.1–1.0)
Monocytes Relative: 13 %
Neutro Abs: 4.6 10*3/uL (ref 1.7–7.7)
Neutrophils Relative %: 46 %
Platelets: 324 10*3/uL (ref 150–400)
RBC: 5.25 MIL/uL — AB (ref 3.87–5.11)
RDW: 13.1 % (ref 11.5–15.5)
WBC: 10.1 10*3/uL (ref 4.0–10.5)

## 2018-02-19 ENCOUNTER — Encounter (HOSPITAL_COMMUNITY): Payer: Self-pay | Admitting: Hematology

## 2018-02-19 ENCOUNTER — Other Ambulatory Visit: Payer: Self-pay

## 2018-02-19 ENCOUNTER — Inpatient Hospital Stay (HOSPITAL_BASED_OUTPATIENT_CLINIC_OR_DEPARTMENT_OTHER): Payer: BLUE CROSS/BLUE SHIELD | Admitting: Hematology

## 2018-02-19 VITALS — BP 147/94 | HR 114 | Temp 97.7°F | Resp 18 | Wt 387.0 lb

## 2018-02-19 DIAGNOSIS — D693 Immune thrombocytopenic purpura: Secondary | ICD-10-CM | POA: Diagnosis not present

## 2018-02-19 NOTE — Progress Notes (Signed)
[place],  [id number]   CLINIC:  Medical Oncology/Hematology  PCP:  Emelda Fear, DO [address] [place] [address] [phone]   REASON FOR VISIT:  Follow-up for ITP.  CURRENT THERAPY: Observation.   INTERVAL HISTORY:  Tracey Maldonado 42 y.o. female returns for follow-up of her platelet counts.  Denies any easy bruising or bleeding.  Her cough is completely gone after she took [employer]  She noticed her left dorsum of the foot is slightly more swollen in the last 2 weeks.  However it is improving at this time.  No shortness of breath noted.  She has completely discontinued prednisone about 2 weeks ago.  No fevers or infections.   REVIEW OF SYSTEMS:  Review of Systems  Cardiovascular: Positive for leg swelling.  All other systems reviewed and are negative.    PAST MEDICAL/SURGICAL HISTORY:  History reviewed. No pertinent past medical history. Past Surgical History:  Procedure Laterality Date  . APPENDECTOMY  1984     SOCIAL HISTORY:  Social History   Socioeconomic History  . Marital status: Widowed    Spouse name: Not on file  . Number of children: Not on file  . Years of education: Not on file  . Highest education level: Not on file  Occupational History  . Not on file  Social Needs  . Financial resource strain: Not on file  . Food insecurity:    Worry: Not on file    Inability: Not on file  . Transportation needs:    Medical: Not on file    Non-medical: Not on file  Tobacco Use  . Smoking status: Former Smoker    Packs/day: 0.25    Years: 5.00    Pack years: 1.25    Types: Cigarettes    Last attempt to quit: 11/26/2001    Years since quitting: 16.2  . Smokeless tobacco: Never Used  Substance and Sexual Activity  . Alcohol use: Yes    Frequency: Never    Comment: Occassionally  . Drug use: Never  . Sexual activity: Not on file  Lifestyle  . Physical activity:    Days per week: Not on file      Minutes per session: Not on file  . Stress: Not on file  Relationships  . Social connections:    Talks on phone: Not on file    Gets together: Not on file    Attends religious service: Not on file    Active member of club or organization: Not on file    Attends meetings of clubs or organizations: Not on file    Relationship status: Not on file  . Intimate partner violence:    Fear of current or ex partner: Not on file    Emotionally abused: Not on file    Physically abused: Not on file    Forced sexual activity: Not on file  Other Topics Concern  . Not on file  Social History Narrative  . Not on file    FAMILY HISTORY:  Family History  Problem Relation Age of Onset  . Hypertension Mother   . Diabetes Paternal Grandmother     CURRENT MEDICATIONS:  Outpatient Encounter Medications as of 02/19/2018  Medication Sig  . benzonatate (TESSALON) 100 MG capsule Take 1 capsule (100 mg total) by mouth 3 (three) times daily as needed for cough.  . [employer] PAIN RELIEF REGULAR ST 325 MG tablet   . cyclobenzaprine (FLEXERIL) 5 MG tablet  Take 1 tablet (5 mg total) by mouth 3 (three) times daily.  Marland Kitchen ESTARYLLA 0.25-35 MG-MCG tablet   . famotidine (PEPCID) 20 MG tablet   . [DISCONTINUED] predniSONE (DELTASONE) 20 MG tablet Take 60 mg by mouth.   No facility-administered encounter medications on file as of 02/19/2018.     ALLERGIES:  No Known Allergies   PHYSICAL EXAM:  ECOG Performance status: 0  Vitals:   02/19/18 1528  BP: (!) 147/94  Pulse: (!) 114  Resp: 18  Temp: 97.7 F (36.5 C)  SpO2: 98%   Filed Weights   02/19/18 1528  Weight: (!) 387 lb (175.5 kg)    Physical Exam   LABORATORY DATA:  I have reviewed the labs as listed.  CBC    Component Value Date/Time   WBC 10.1 02/18/2018 1516   RBC 5.25 (H) 02/18/2018 1516   HGB 14.7 02/18/2018 1516   HCT 44.5 02/18/2018 1516   PLT 324 02/18/2018 1516   MCV 84.8 02/18/2018 1516   MCH 28.0 02/18/2018 1516   MCHC 33.0  02/18/2018 1516   RDW 13.1 02/18/2018 1516   LYMPHSABS 3.4 02/18/2018 1516   MONOABS 1.3 (H) 02/18/2018 1516   EOSABS 0.8 (H) 02/18/2018 1516   BASOSABS 0.1 02/18/2018 1516   CMP Latest Ref Rng & Units 02/18/2018 01/15/2018 01/03/2018  Glucose 70 - 99 mg/dL 71 118(H) 111(H)  BUN 6 - 20 mg/dL 14 13 22(H)  Creatinine 0.44 - 1.00 mg/dL 0.77 0.86 1.03(H)  Sodium 135 - 145 mmol/L 138 134(L) 138  Potassium 3.5 - 5.1 mmol/L 4.2 4.2 3.8  Chloride 98 - 111 mmol/L 104 99(L) 100(L)  CO2 22 - 32 mmol/L 26 25 27   Calcium 8.9 - 10.3 mg/dL 9.4 9.4 9.1  Total Protein 6.5 - 8.1 g/dL 8.2(H) 8.2(H) 7.0  Total Bilirubin 0.3 - 1.2 mg/dL 0.4 0.8 1.2  Alkaline Phos 38 - 126 U/L 58 48 40  AST 15 - 41 U/L 21 25 41  ALT 0 - 44 U/L 20 31 78(H)       DIAGNOSTIC IMAGING:  I have independently reviewed mammogram dated 02/14/2018 and discussed results with her.    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 on 11/19/2017 with easy bruising and bleeding, platelet count 0, status post IVIG 400 mg/kg/day for 5 days, and prednisone 175 mg daily, decrease 200 mg daily at discharge -CT scan of the neck, chest, abdomen and pelvis on 11/20/2017 did not reveal any masses or adenopathy or splenomegaly, peripheral smear confirmed from cytopenia with no platelet counts - Platelet count started falling when patient on prednisone 80 mg daily to 31,000. - Weekly rituximab x4 from 12/13/2017 through 01/03/2018 with improvement in platelet count.  Platelet count today 324. - She was tapered off of prednisone about 2 weeks ago.  As her platelet count is stable today, I will see her back in 2 months for follow-up with repeat CBC.  She was told to come back sooner should she develop any easy bruising or bleeding.    2.  Dry cough: -She has developed dry cough after second weekly infusion of rituximab.  We have done a chest x-ray which did not  show any cardiopulmonary disease. -Patient showed improvement in the cough after she started taking Mucinex and a cough syrup.  She still has some lingering cough.  I have given a prescription for Tessalon Perles 100 mg 3 times a day  for 7 to 10 days.  She has used [employer] in the cough is completely gone at this time.  3.  Health maintenance: - We discussed the results of the mammogram dated 02/14/2018 which was BI-RADS Category 1.       Orders placed this encounter:  Orders Placed This Encounter  Procedures  . CBC with Differential  . Comprehensive metabolic panel      Derek Jack, MD [place] [phone]

## 2018-02-19 NOTE — Assessment & Plan Note (Signed)
1.  Immune thrombocytopenia: - Admitted to [facility] on 11/19/2017 with easy bruising and bleeding, platelet count 0, status post IVIG 400 mg/kg/day for 5 days, and prednisone 175 mg daily, decrease 200 mg daily at discharge -CT scan of the neck, chest, abdomen and pelvis on 11/20/2017 did not reveal any masses or adenopathy or splenomegaly, peripheral smear confirmed from cytopenia with no platelet counts - Platelet count started falling when patient on prednisone 80 mg daily to 31,000. - Weekly rituximab x4 from 12/13/2017 through 01/03/2018 with improvement in platelet count.  Platelet count today 324. - She was tapered off of prednisone about 2 weeks ago.  As her platelet count is stable today, I will see her back in 2 months for follow-up with repeat CBC.  She was told to come back sooner should she develop any easy bruising or bleeding.    2.  Dry cough: -She has developed dry cough after second weekly infusion of rituximab.  We have done a chest x-ray which did not show any cardiopulmonary disease. -Patient showed improvement in the cough after she started taking Mucinex and a cough syrup.  She still has some lingering cough.  I have given a prescription for Tessalon Perles 100 mg 3 times a day for 7 to 10 days.  She has used [employer] in the cough is completely gone at this time.  3.  Health maintenance: - We discussed the results of the mammogram dated 02/14/2018 which was BI-RADS Category 1.

## 2018-02-20 ENCOUNTER — Telehealth (HOSPITAL_COMMUNITY): Payer: Self-pay | Admitting: *Deleted

## 2018-04-24 ENCOUNTER — Inpatient Hospital Stay (HOSPITAL_COMMUNITY): Payer: BLUE CROSS/BLUE SHIELD | Attending: Hematology

## 2018-04-24 DIAGNOSIS — M7989 Other specified soft tissue disorders: Secondary | ICD-10-CM | POA: Insufficient documentation

## 2018-04-24 DIAGNOSIS — R05 Cough: Secondary | ICD-10-CM | POA: Insufficient documentation

## 2018-04-24 DIAGNOSIS — D693 Immune thrombocytopenic purpura: Secondary | ICD-10-CM | POA: Insufficient documentation

## 2018-04-24 DIAGNOSIS — Z87891 Personal history of nicotine dependence: Secondary | ICD-10-CM | POA: Diagnosis not present

## 2018-04-24 LAB — CBC WITH DIFFERENTIAL/PLATELET
BASOS PCT: 0 %
Basophils Absolute: 0 10*3/uL (ref 0.0–0.1)
EOS ABS: 0.3 10*3/uL (ref 0.0–0.7)
EOS PCT: 3 %
HCT: 42.1 % (ref 36.0–46.0)
Hemoglobin: 14.1 g/dL (ref 12.0–15.0)
Lymphocytes Relative: 25 %
Lymphs Abs: 1.9 10*3/uL (ref 0.7–4.0)
MCH: 28.4 pg (ref 26.0–34.0)
MCHC: 33.5 g/dL (ref 30.0–36.0)
MCV: 84.7 fL (ref 78.0–100.0)
MONO ABS: 0.8 10*3/uL (ref 0.1–1.0)
Monocytes Relative: 10 %
Neutro Abs: 4.6 10*3/uL (ref 1.7–7.7)
Neutrophils Relative %: 62 %
PLATELETS: 332 10*3/uL (ref 150–400)
RBC: 4.97 MIL/uL (ref 3.87–5.11)
RDW: 14.5 % (ref 11.5–15.5)
WBC: 7.6 10*3/uL (ref 4.0–10.5)

## 2018-04-24 LAB — COMPREHENSIVE METABOLIC PANEL
ALT: 13 U/L (ref 0–44)
AST: 19 U/L (ref 15–41)
Albumin: 3.6 g/dL (ref 3.5–5.0)
Alkaline Phosphatase: 45 U/L (ref 38–126)
Anion gap: 8 (ref 5–15)
BUN: 9 mg/dL (ref 6–20)
CHLORIDE: 104 mmol/L (ref 98–111)
CO2: 26 mmol/L (ref 22–32)
CREATININE: 0.73 mg/dL (ref 0.44–1.00)
Calcium: 9.3 mg/dL (ref 8.9–10.3)
GFR calc Af Amer: >60 mL/min (ref >60)
Glucose, Bld: 100 mg/dL — ABNORMAL HIGH (ref 70–99)
POTASSIUM: 4.4 mmol/L (ref 3.5–5.1)
SODIUM: 138 mmol/L (ref 135–145)
Total Bilirubin: 0.8 mg/dL (ref 0.3–1.2)
Total Protein: 8 g/dL (ref 6.5–8.1)

## 2018-04-25 ENCOUNTER — Other Ambulatory Visit: Payer: Self-pay

## 2018-04-25 ENCOUNTER — Inpatient Hospital Stay (HOSPITAL_BASED_OUTPATIENT_CLINIC_OR_DEPARTMENT_OTHER): Payer: BLUE CROSS/BLUE SHIELD | Admitting: Hematology

## 2018-04-25 ENCOUNTER — Encounter (HOSPITAL_COMMUNITY): Payer: Self-pay | Admitting: Hematology

## 2018-04-25 VITALS — BP 155/84 | HR 98 | Temp 99.0°F | Resp 18 | Wt 392.5 lb

## 2018-04-25 DIAGNOSIS — R05 Cough: Secondary | ICD-10-CM

## 2018-04-25 DIAGNOSIS — Z87891 Personal history of nicotine dependence: Secondary | ICD-10-CM | POA: Diagnosis not present

## 2018-04-25 DIAGNOSIS — D693 Immune thrombocytopenic purpura: Secondary | ICD-10-CM

## 2018-04-25 DIAGNOSIS — M7989 Other specified soft tissue disorders: Secondary | ICD-10-CM

## 2018-04-25 MED ORDER — FUROSEMIDE 20 MG PO TABS: 20.0000 mg | ORAL_TABLET | Freq: Every day | ORAL | 1 refills | Status: DC | PRN

## 2018-04-25 NOTE — Patient Instructions (Addendum)
[facility] Discharge Instructions Follow up in 4 months with labs    Thank you for choosing [facility] at [facility] to provide your oncology and hematology care.  To afford each patient quality time with our provider, please arrive at least 15 minutes before your scheduled appointment time.   If you have a lab appointment with the Cancer Center please come in thru the  Main Entrance and check in at the main information desk  You need to re-schedule your appointment should you arrive 10 or more minutes late.  We strive to give you quality time with our providers, and arriving late affects you and other patients whose appointments are after yours.  Also, if you no show three or more times for appointments you may be dismissed from the clinic at the providers discretion.     Again, thank you for choosing [facility].  Our hope is that these requests will decrease the amount of time that you wait before being seen by our physicians.       _____________________________________________________________  Should you have questions after your visit to [facility], please contact our office at [phone] between the hours of 8:00 a.m. and 4:30 p.m.  Voicemails left after 4:00 p.m. will not be returned until the following business day.  For prescription refill requests, have your pharmacy contact our office and allow 72 hours.    Cancer Center Support Programs:   > Cancer Support Group  2nd Tuesday of the month 1pm-2pm, Journey Room    

## 2018-04-25 NOTE — Assessment & Plan Note (Signed)
1.  Immune thrombocytopenia: - Admitted to [facility] on 11/19/2017 with easy bruising and bleeding, platelet count 0, status post IVIG 400 mg/kg/day for 5 days, and prednisone 175 mg daily, decrease 200 mg daily at discharge -CT scan of the neck, chest, abdomen and pelvis on 11/20/2017 did not reveal any masses or adenopathy or splenomegaly, peripheral smear confirmed from cytopenia with no platelet counts - Platelet count started falling when patient on prednisone 80 mg daily to 31,000. - Weekly rituximab x4 from 12/13/2017 through 01/03/2018 with improvement in the platelet count.  - Prednisone was tapered off in mid June 2019.  She has felt increased stiffness in her knee joints since the prednisone was tapered off.  She also has gained weight and is having hard time to lose it.  She does have lower extremity swelling with edema.  We will give her Lasix 20 mg tablet to be taken as needed.  She will also take potassium supplements with it. -We reviewed her blood count today which shows platelet count of 334.  As her platelet counts are staying stable, I will switch her follow-up visit to 4 months.  2.  Dry cough: -She developed dry cough after second weekly infusion of rituximab.  The cough completely resolved after she completed prednisone taper.   3.  Health maintenance: - We discussed the results of the mammogram dated 02/14/2018 which was BI-RADS Category 1.

## 2018-04-25 NOTE — Progress Notes (Signed)
[place], [place] [address]   CLINIC:  Medical Oncology/Hematology  PCP:  Emelda Fear, DO [address] [place] [address] [phone]   REASON FOR VISIT:  Follow-up for ITP  CURRENT THERAPY: Observation   INTERVAL HISTORY:  Tracey Maldonado 42 y.o. female returns for routine follow-up for ITP. Patient is doing better since her last visit. She reports she did not like the prednisone. She had increased appetite, weight gain, and her joint hurt after she stopped the medication. She also is having bilateral lower extremity edema. Patient reports her appetite and energy level at 100%. Denies any new pains. Denies any fevers or recent infection.    REVIEW OF SYSTEMS:  Review of Systems  Cardiovascular: Positive for leg swelling.  All other systems reviewed and are negative.    PAST MEDICAL/SURGICAL HISTORY:  History reviewed. No pertinent past medical history. Past Surgical History:  Procedure Laterality Date  . APPENDECTOMY  1984     SOCIAL HISTORY:  Social History   Socioeconomic History  . Marital status: Widowed    Spouse name: Not on file  . Number of children: Not on file  . Years of education: Not on file  . Highest education level: Not on file  Occupational History  . Not on file  Social Needs  . Financial resource strain: Not on file  . Food insecurity:    Worry: Not on file    Inability: Not on file  . Transportation needs:    Medical: Not on file    Non-medical: Not on file  Tobacco Use  . Smoking status: Former Smoker    Packs/day: 0.25    Years: 5.00    Pack years: 1.25    Types: Cigarettes    Last attempt to quit: 11/26/2001    Years since quitting: 16.4  . Smokeless tobacco: Never Used  Substance and Sexual Activity  . Alcohol use: Yes    Frequency: Never    Comment: Occassionally  . Drug use: Never  . Sexual activity: Not on file  Lifestyle  . Physical activity:    Days per week: Not on file   Minutes per session: Not on file  . Stress: Not on file  Relationships  . Social connections:    Talks on phone: Not on file    Gets together: Not on file    Attends religious service: Not on file    Active member of club or organization: Not on file    Attends meetings of clubs or organizations: Not on file    Relationship status: Not on file  . Intimate partner violence:    Fear of current or ex partner: Not on file    Emotionally abused: Not on file    Physically abused: Not on file    Forced sexual activity: Not on file  Other Topics Concern  . Not on file  Social History Narrative  . Not on file    FAMILY HISTORY:  Family History  Problem Relation Age of Onset  . Hypertension Mother   . Diabetes Paternal Grandmother     CURRENT MEDICATIONS:  Outpatient Encounter Medications as of 04/25/2018  Medication Sig  . benzonatate (TESSALON) 100 MG capsule Take 1 capsule (100 mg total) by mouth 3 (three) times daily as needed for cough.  . [employer] PAIN RELIEF REGULAR ST 325 MG tablet   . cyclobenzaprine (FLEXERIL) 5 MG tablet Take 1 tablet (5 mg total) by mouth 3 (three)  times daily.  Mar[employer] Kitchen ESTARYLLA 0.25-35 MG-MCG tablet   . famotidine (PEPCID) 20 MG tablet   . furosemide (LASIX) 20 MG tablet Take 1 tablet (20 mg total) by mouth daily as needed.   No facility-administered encounter medications on file as of 04/25/2018.     ALLERGIES:  No Known Allergies   PHYSICAL EXAM:  ECOG Performance status: 1  Vitals:   04/25/18 1432  BP: (!) 155/84  Pulse: 98  Resp: 18  Temp: 99 F (37.2 C)  SpO2: 98%   Filed Weights   04/25/18 1432  Weight: (!) 392 lb 8 oz (178 kg)    Physical Exam  Constitutional: She is oriented to person, place, and time. She appears well-developed and well-nourished.  Musculoskeletal: She exhibits edema (lower extremities).  Neurological: She is alert and oriented to person, place, and time.  Skin: Skin is warm and dry.  Psychiatric: She has a normal mood  and affect. Her behavior is normal. Judgment and thought content normal.     LABORATORY DATA:  I have reviewed the labs as listed.  CBC    Component Value Date/Time   WBC 7.6 04/24/2018 0923   RBC 4.97 04/24/2018 0923   HGB 14.1 04/24/2018 0923   HCT 42.1 04/24/2018 0923   PLT 332 04/24/2018 0923   MCV 84.7 04/24/2018 0923   MCH 28.4 04/24/2018 0923   MCHC 33.5 04/24/2018 0923   RDW 14.5 04/24/2018 0923   LYMPHSABS 1.9 04/24/2018 0923   MONOABS 0.8 04/24/2018 0923   EOSABS 0.3 04/24/2018 0923   BASOSABS 0.0 04/24/2018 0923   CMP Latest Ref Rng & Units 04/24/2018 02/18/2018 01/15/2018  Glucose 70 - 99 mg/dL 100(H) 71 118(H)  BUN 6 - 20 mg/dL 9 14 13   Creatinine 0.44 - 1.00 mg/dL 0.73 0.77 0.86  Sodium 135 - 145 mmol/L 138 138 134(L)  Potassium 3.5 - 5.1 mmol/L 4.4 4.2 4.2  Chloride 98 - 111 mmol/L 104 104 99(L)  CO2 22 - 32 mmol/L 26 26 25   Calcium 8.9 - 10.3 mg/dL 9.3 9.4 9.4  Total Protein 6.5 - 8.1 g/dL 8.0 8.2(H) 8.2(H)  Total Bilirubin 0.3 - 1.2 mg/dL 0.8 0.4 0.8  Alkaline Phos 38 - 126 U/L 45 58 48  AST 15 - 41 U/L 19 21 25   ALT 0 - 44 U/L 13 20 31           ASSESSMENT & PLAN:   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 - Weekly rituximab x4 from 12/13/2017 through 01/03/2018 with improvement in the platelet count.  - Prednisone was tapered off in mid June 2019.  She has felt increased stiffness in her knee joints since the prednisone was tapered off.  She also has  gained weight and is having hard time to lose it.  She does have lower extremity swelling with edema.  We will give her Lasix 20 mg tablet to be taken as needed.  She will also take potassium supplements with it. -We reviewed her blood count today which shows platelet count  of 334.  As her platelet counts are staying stable, I will switch her follow-up visit to 4 months.  2.  Dry cough: -She developed dry cough after second weekly infusion of rituximab.  The cough completely resolved after she completed prednisone taper.   3.  Health maintenance: - We discussed the results of the mammogram dated 02/14/2018 which was BI-RADS Category 1.       Orders placed this encounter:  Orders Placed This Encounter  Procedures  . CBC with Differential/Platelet  . Comprehensive metabolic panel  . Lactate dehydrogenase      Derek Jack, MD [place] [phone]

## 2018-05-17 ENCOUNTER — Other Ambulatory Visit (HOSPITAL_COMMUNITY): Payer: Self-pay | Admitting: Nurse Practitioner

## 2018-05-17 DIAGNOSIS — D693 Immune thrombocytopenic purpura: Secondary | ICD-10-CM

## 2018-05-17 DIAGNOSIS — M7989 Other specified soft tissue disorders: Secondary | ICD-10-CM

## 2018-05-31 ENCOUNTER — Other Ambulatory Visit (HOSPITAL_COMMUNITY): Payer: Self-pay | Admitting: Nurse Practitioner

## 2018-05-31 DIAGNOSIS — M7989 Other specified soft tissue disorders: Secondary | ICD-10-CM

## 2018-05-31 DIAGNOSIS — D693 Immune thrombocytopenic purpura: Secondary | ICD-10-CM

## 2018-05-31 MED ORDER — FUROSEMIDE 20 MG PO TABS: 20.0000 mg | ORAL_TABLET | Freq: Every day | ORAL | 1 refills | Status: AC | PRN

## 2018-08-29 ENCOUNTER — Other Ambulatory Visit (HOSPITAL_COMMUNITY): Payer: BLUE CROSS/BLUE SHIELD

## 2018-09-05 ENCOUNTER — Ambulatory Visit (HOSPITAL_COMMUNITY): Payer: BLUE CROSS/BLUE SHIELD | Admitting: Hematology

## 2018-09-09 ENCOUNTER — Inpatient Hospital Stay (HOSPITAL_COMMUNITY): Payer: BLUE CROSS/BLUE SHIELD | Attending: Hematology

## 2018-09-09 ENCOUNTER — Inpatient Hospital Stay (HOSPITAL_BASED_OUTPATIENT_CLINIC_OR_DEPARTMENT_OTHER): Payer: BLUE CROSS/BLUE SHIELD | Admitting: Hematology

## 2018-09-09 ENCOUNTER — Other Ambulatory Visit: Payer: Self-pay

## 2018-09-09 ENCOUNTER — Encounter (HOSPITAL_COMMUNITY): Payer: Self-pay | Admitting: Hematology

## 2018-09-09 VITALS — BP 152/96 | HR 107 | Temp 98.4°F | Resp 18 | Wt 391.0 lb

## 2018-09-09 DIAGNOSIS — Z87891 Personal history of nicotine dependence: Secondary | ICD-10-CM | POA: Diagnosis not present

## 2018-09-09 DIAGNOSIS — Z9225 Personal history of immunosupression therapy: Secondary | ICD-10-CM | POA: Diagnosis not present

## 2018-09-09 DIAGNOSIS — Z7952 Long term (current) use of systemic steroids: Secondary | ICD-10-CM | POA: Insufficient documentation

## 2018-09-09 DIAGNOSIS — D693 Immune thrombocytopenic purpura: Secondary | ICD-10-CM | POA: Insufficient documentation

## 2018-09-09 DIAGNOSIS — Z79899 Other long term (current) drug therapy: Secondary | ICD-10-CM | POA: Insufficient documentation

## 2018-09-09 LAB — COMPREHENSIVE METABOLIC PANEL
ALT: 15 U/L (ref 0–44)
ANION GAP: 9 (ref 5–15)
AST: 20 U/L (ref 15–41)
Albumin: 3.5 g/dL (ref 3.5–5.0)
Alkaline Phosphatase: 42 U/L (ref 38–126)
BILIRUBIN TOTAL: 0.4 mg/dL (ref 0.3–1.2)
BUN: 15 mg/dL (ref 6–20)
CHLORIDE: 104 mmol/L (ref 98–111)
CO2: 24 mmol/L (ref 22–32)
Calcium: 9.6 mg/dL (ref 8.9–10.3)
Creatinine, Ser: 0.8 mg/dL (ref 0.44–1.00)
Glucose, Bld: 95 mg/dL (ref 70–99)
Potassium: 4 mmol/L (ref 3.5–5.1)
Sodium: 137 mmol/L (ref 135–145)
TOTAL PROTEIN: 8.1 g/dL (ref 6.5–8.1)

## 2018-09-09 LAB — CBC WITH DIFFERENTIAL/PLATELET
ABS IMMATURE GRANULOCYTES: 0.02 10*3/uL (ref 0.00–0.07)
BASOS ABS: 0.1 10*3/uL (ref 0.0–0.1)
BASOS PCT: 1 %
Eosinophils Absolute: 0.7 10*3/uL — ABNORMAL HIGH (ref 0.0–0.5)
Eosinophils Relative: 7 %
HCT: 44.8 % (ref 36.0–46.0)
Hemoglobin: 14.4 g/dL (ref 12.0–15.0)
IMMATURE GRANULOCYTES: 0 %
Lymphocytes Relative: 21 %
Lymphs Abs: 2 10*3/uL (ref 0.7–4.0)
MCH: 28 pg (ref 26.0–34.0)
MCHC: 32.1 g/dL (ref 30.0–36.0)
MCV: 87.2 fL (ref 80.0–100.0)
MONO ABS: 0.7 10*3/uL (ref 0.1–1.0)
Monocytes Relative: 7 %
NEUTROS ABS: 6 10*3/uL (ref 1.7–7.7)
NEUTROS PCT: 64 %
NRBC: 0 % (ref 0.0–0.2)
PLATELETS: 232 10*3/uL (ref 150–400)
RBC: 5.14 MIL/uL — AB (ref 3.87–5.11)
RDW: 13.3 % (ref 11.5–15.5)
WBC: 9.3 10*3/uL (ref 4.0–10.5)

## 2018-09-09 LAB — LACTATE DEHYDROGENASE: LDH: 142 U/L (ref 98–192)

## 2018-09-09 NOTE — Progress Notes (Signed)
[place], [place] [address]   CLINIC:  Medical Oncology/Hematology  PCP:  Emelda Fear, DO [address] [place] [address] [phone]   REASON FOR VISIT: Follow-up for ITP  CURRENT THERAPY: Observation    INTERVAL HISTORY:  Tracey Maldonado 43 y.o. female returns for routine follow-up for ITP.  Denies any fevers, night sweats or weight loss in the last 4 months.  Denies any infections or hospitalizations.  Denies any easy bruising or bleeding.  No new medications were started.  She continues to work at the [employer].  She did not receive flu vaccine in the last 10 years.    REVIEW OF SYSTEMS:  Review of Systems  All other systems reviewed and are negative.    PAST MEDICAL/SURGICAL HISTORY:  History reviewed. No pertinent past medical history. Past Surgical History:  Procedure Laterality Date  . APPENDECTOMY  1984     SOCIAL HISTORY:  Social History   Socioeconomic History  . Marital status: Widowed    Spouse name: Not on file  . Number of children: Not on file  . Years of education: Not on file  . Highest education level: Not on file  Occupational History  . Not on file  Social Needs  . Financial resource strain: Not on file  . Food insecurity:    Worry: Not on file    Inability: Not on file  . Transportation needs:    Medical: Not on file    Non-medical: Not on file  Tobacco Use  . Smoking status: Former Smoker    Packs/day: 0.25    Years: 5.00    Pack years: 1.25    Types: Cigarettes    Last attempt to quit: 11/26/2001    Years since quitting: 16.7  . Smokeless tobacco: Never Used  Substance and Sexual Activity  . Alcohol use: Yes    Frequency: Never    Comment: Occassionally  . Drug use: Never  . Sexual activity: Not on file  Lifestyle  . Physical activity:    Days per week: Not on file    Minutes per session: Not on file  . Stress: Not on file  Relationships  . Social connections:    Talks on phone:  Not on file    Gets together: Not on file    Attends religious service: Not on file    Active member of club or organization: Not on file    Attends meetings of clubs or organizations: Not on file    Relationship status: Not on file  . Intimate partner violence:    Fear of current or ex partner: Not on file    Emotionally abused: Not on file    Physically abused: Not on file    Forced sexual activity: Not on file  Other Topics Concern  . Not on file  Social History Narrative  . Not on file    FAMILY HISTORY:  Family History  Problem Relation Age of Onset  . Hypertension Mother   . Diabetes Paternal Grandmother     CURRENT MEDICATIONS:  Outpatient Encounter Medications as of 09/09/2018  Medication Sig  . ESTARYLLA 0.25-35 MG-MCG tablet   . [facility] PAIN RELIEF REGULAR ST 325 MG tablet   . furosemide (LASIX) 20 MG tablet Take 1 tablet (20 mg total) by mouth daily as needed. (Patient not taking: Reported on 09/09/2018)  . [DISCONTINUED] benzonatate (TESSALON) 100 MG capsule Take 1 capsule (100 mg total) by mouth 3 (three)  times daily as needed for cough.  . [DISCONTINUED] cyclobenzaprine (FLEXERIL) 5 MG tablet Take 1 tablet (5 mg total) by mouth 3 (three) times daily.  . [DISCONTINUED] famotidine (PEPCID) 20 MG tablet    No facility-administered encounter medications on file as of 09/09/2018.     ALLERGIES:  No Known Allergies   PHYSICAL EXAM:  ECOG Performance status: 1  Vitals:   09/09/18 1400  BP: (!) 152/96  Pulse: (!) 107  Resp: 18  Temp: 98.4 F (36.9 C)  SpO2: 98%   Filed Weights   09/09/18 1400  Weight: (!) 391 lb (177.4 kg)    Physical Exam Constitutional:      Appearance: Normal appearance.  Eyes:     Conjunctiva/sclera: Conjunctivae normal.  Cardiovascular:     Rate and Rhythm: Normal rate and regular rhythm.     Pulses: Normal pulses.  Pulmonary:     Effort: Pulmonary effort is normal.     Breath sounds: Normal breath sounds.  Abdominal:      Palpations: Abdomen is soft. There is no mass.     Tenderness: There is no abdominal tenderness.  Skin:    General: Skin is warm.  Neurological:     General: No focal deficit present.     Mental Status: She is alert and oriented to person, place, and time.  Psychiatric:        Mood and Affect: Mood normal.        Behavior: Behavior normal.      LABORATORY DATA:  I have reviewed the labs as listed.  CBC    Component Value Date/Time   WBC 9.3 09/09/2018 1335   RBC 5.14 (H) 09/09/2018 1335   HGB 14.4 09/09/2018 1335   HCT 44.8 09/09/2018 1335   PLT 232 09/09/2018 1335   MCV 87.2 09/09/2018 1335   MCH 28.0 09/09/2018 1335   MCHC 32.1 09/09/2018 1335   RDW 13.3 09/09/2018 1335   LYMPHSABS 2.0 09/09/2018 1335   MONOABS 0.7 09/09/2018 1335   EOSABS 0.7 (H) 09/09/2018 1335   BASOSABS 0.1 09/09/2018 1335   CMP Latest Ref Rng & Units 09/09/2018 04/24/2018 02/18/2018  Glucose 70 - 99 mg/dL 95 100(H) 71  BUN 6 - 20 mg/dL 15 9 14   Creatinine 0.44 - 1.00 mg/dL 0.80 0.73 0.77  Sodium 135 - 145 mmol/L 137 138 138  Potassium 3.5 - 5.1 mmol/L 4.0 4.4 4.2  Chloride 98 - 111 mmol/L 104 104 104  CO2 22 - 32 mmol/L 24 26 26   Calcium 8.9 - 10.3 mg/dL 9.6 9.3 9.4  Total Protein 6.5 - 8.1 g/dL 8.1 8.0 8.2(H)  Total Bilirubin 0.3 - 1.2 mg/dL 0.4 0.8 0.4  Alkaline Phos 38 - 126 U/L 42 45 58  AST 15 - 41 U/L 20 19 21   ALT 0 - 44 U/L 15 13 20        DIAGNOSTIC IMAGING:  I have independently reviewed the scans and discussed with the patient.   I have reviewed Francene Finders, NP's note and agree with the documentation.  I personally performed a face-to-face visit, made revisions and my assessment and plan is as follows.    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 - Weekly rituximab x4 from 12/13/2017 through 01/03/2018 with improvement in the platelet count.   - Prednisone was tapered off in June 2019.  She is no longer requiring Lasix. -Denies any fevers, night sweats or weight loss in the last 4 months.  Denies any infections or hospitalizations.  Denies any bleeding or easy bruising. - We reviewed her blood work today.  Platelet count was 232.  This was 332 4 months ago.  Hemoglobin and white count were normal.  Mild increase in eosinophils.  No new medications were reported. -Physical examination did not reveal any lymphadenopathy. -I will see her back in 4 months for follow-up with repeat blood counts.  If her platelet count remains stable, I will switch her to every 70-month visits.  2.  Health maintenance: - Her last mammogram was on 02/14/2018, BI-RADS Category 1. -We will schedule her next mammogram next visit.        Orders placed this encounter:  Orders Placed This Encounter  Procedures  . Lactate dehydrogenase  . CBC with Differential/Platelet  . Comprehensive metabolic panel      Derek Jack, MD [facility] [place] [phone]

## 2018-09-09 NOTE — Assessment & Plan Note (Signed)
1.  Immune thrombocytopenia: - Admitted to [facility] on 11/19/2017 with easy bruising and bleeding, platelet count 0, status post IVIG 400 mg/kg/day for 5 days, and prednisone 175 mg daily, decrease to 100 mg daily at discharge -CT scan of the neck, chest, abdomen and pelvis on 11/20/2017 did not reveal any masses or adenopathy or splenomegaly, peripheral smear confirmed from cytopenia with no platelet counts - Platelet count started falling when patient on prednisone 80 mg daily to 31,000. - Weekly rituximab x4 from 12/13/2017 through 01/03/2018 with improvement in the platelet count.   - Prednisone was tapered off in June 2019.  She is no longer requiring Lasix. -Denies any fevers, night sweats or weight loss in the last 4 months.  Denies any infections or hospitalizations.  Denies any bleeding or easy bruising. - We reviewed her blood work today.  Platelet count was 232.  This was 332 4 months ago.  Hemoglobin and white count were normal.  Mild increase in eosinophils.  No new medications were reported. -Physical examination did not reveal any lymphadenopathy. -I will see her back in 4 months for follow-up with repeat blood counts.  If her platelet count remains stable, I will switch her to every 12-month visits.  2.  Health maintenance: - Her last mammogram was on 02/14/2018, BI-RADS Category 1. -We will schedule her next mammogram next visit.

## 2018-09-09 NOTE — Patient Instructions (Signed)
[facility] at [facility] Discharge Instructions Follow up in 4 months with labs    Thank you for choosing [facility] at [facility] to provide your oncology and hematology care.  To afford each patient quality time with our provider, please arrive at least 15 minutes before your scheduled appointment time.   If you have a lab appointment with the Cancer Center please come in thru the  Main Entrance and check in at the main information desk  You need to re-schedule your appointment should you arrive 10 or more minutes late.  We strive to give you quality time with our providers, and arriving late affects you and other patients whose appointments are after yours.  Also, if you no show three or more times for appointments you may be dismissed from the clinic at the providers discretion.     Again, thank you for choosing [facility].  Our hope is that these requests will decrease the amount of time that you wait before being seen by our physicians.       _____________________________________________________________  Should you have questions after your visit to [facility], please contact our office at [phone] between the hours of 8:00 a.m. and 4:30 p.m.  Voicemails left after 4:00 p.m. will not be returned until the following business day.  For prescription refill requests, have your pharmacy contact our office and allow 72 hours.    Cancer Center Support Programs:   > Cancer Support Group  2nd Tuesday of the month 1pm-2pm, Journey Room    

## 2019-01-16 ENCOUNTER — Other Ambulatory Visit: Payer: Self-pay

## 2019-01-17 ENCOUNTER — Inpatient Hospital Stay (HOSPITAL_COMMUNITY): Payer: BLUE CROSS/BLUE SHIELD | Attending: Hematology

## 2019-01-17 ENCOUNTER — Encounter (HOSPITAL_COMMUNITY): Payer: Self-pay | Admitting: Hematology

## 2019-01-17 ENCOUNTER — Inpatient Hospital Stay (HOSPITAL_BASED_OUTPATIENT_CLINIC_OR_DEPARTMENT_OTHER): Payer: BLUE CROSS/BLUE SHIELD | Admitting: Hematology

## 2019-01-17 VITALS — BP 158/84 | HR 100 | Temp 98.5°F | Resp 16 | Wt >= 6400 oz

## 2019-01-17 DIAGNOSIS — D693 Immune thrombocytopenic purpura: Secondary | ICD-10-CM | POA: Insufficient documentation

## 2019-01-17 DIAGNOSIS — Z87891 Personal history of nicotine dependence: Secondary | ICD-10-CM | POA: Insufficient documentation

## 2019-01-17 DIAGNOSIS — R7989 Other specified abnormal findings of blood chemistry: Secondary | ICD-10-CM

## 2019-01-17 DIAGNOSIS — Z79899 Other long term (current) drug therapy: Secondary | ICD-10-CM | POA: Diagnosis not present

## 2019-01-17 DIAGNOSIS — R945 Abnormal results of liver function studies: Secondary | ICD-10-CM

## 2019-01-17 LAB — CBC WITH DIFFERENTIAL/PLATELET
Abs Immature Granulocytes: 0.02 10*3/uL (ref 0.00–0.07)
Basophils Absolute: 0.1 10*3/uL (ref 0.0–0.1)
Basophils Relative: 1 %
Eosinophils Absolute: 1.2 10*3/uL — ABNORMAL HIGH (ref 0.0–0.5)
Eosinophils Relative: 13 %
HCT: 43.5 % (ref 36.0–46.0)
Hemoglobin: 14.1 g/dL (ref 12.0–15.0)
Immature Granulocytes: 0 %
Lymphocytes Relative: 27 %
Lymphs Abs: 2.5 10*3/uL (ref 0.7–4.0)
MCH: 28.7 pg (ref 26.0–34.0)
MCHC: 32.4 g/dL (ref 30.0–36.0)
MCV: 88.4 fL (ref 80.0–100.0)
Monocytes Absolute: 0.7 10*3/uL (ref 0.1–1.0)
Monocytes Relative: 8 %
Neutro Abs: 4.8 10*3/uL (ref 1.7–7.7)
Neutrophils Relative %: 51 %
Platelets: 267 10*3/uL (ref 150–400)
RBC: 4.92 MIL/uL (ref 3.87–5.11)
RDW: 13.2 % (ref 11.5–15.5)
WBC: 9.2 10*3/uL (ref 4.0–10.5)
nRBC: 0 % (ref 0.0–0.2)

## 2019-01-17 LAB — COMPREHENSIVE METABOLIC PANEL
ALT: 113 U/L — ABNORMAL HIGH (ref 0–44)
AST: 99 U/L — ABNORMAL HIGH (ref 15–41)
Albumin: 3.3 g/dL — ABNORMAL LOW (ref 3.5–5.0)
Alkaline Phosphatase: 84 U/L (ref 38–126)
Anion gap: 11 (ref 5–15)
BUN: 14 mg/dL (ref 6–20)
CO2: 25 mmol/L (ref 22–32)
Calcium: 9.2 mg/dL (ref 8.9–10.3)
Chloride: 102 mmol/L (ref 98–111)
Creatinine, Ser: 0.81 mg/dL (ref 0.44–1.00)
GFR calc Af Amer: >60 mL/min (ref >60)
GFR calc non Af Amer: >60 mL/min (ref >60)
Glucose, Bld: 91 mg/dL (ref 70–99)
Potassium: 4.1 mmol/L (ref 3.5–5.1)
Sodium: 138 mmol/L (ref 135–145)
Total Bilirubin: 0.7 mg/dL (ref 0.3–1.2)
Total Protein: 8.4 g/dL — ABNORMAL HIGH (ref 6.5–8.1)

## 2019-01-17 LAB — LACTATE DEHYDROGENASE: LDH: 173 U/L (ref 98–192)

## 2019-01-17 NOTE — Progress Notes (Signed)
Tracey Maldonado,  [id number]   CLINIC:  Medical Oncology/Hematology  PCP:  Tracey Fear, DO [address] [place] [address] [phone]   REASON FOR VISIT:  Follow-up for ITP    INTERVAL HISTORY:  Tracey Maldonado 43 y.o. female returns for routine follow-up. She is here today alone. She states that she has been well since her last visit. Denies any nausea, vomiting, or diarrhea. Denies any new pains. Had not noticed any recent bleeding such as epistaxis, hematuria or hematochezia. Denies recent chest pain on exertion, shortness of breath on minimal exertion, pre-syncopal episodes, or palpitations. Denies any numbness or tingling in hands or feet. Denies any recent fevers, infections, or recent hospitalizations. Patient reports appetite at 100% and energy level at 100%.   REVIEW OF SYSTEMS:  Review of Systems  All other systems reviewed and are negative.    PAST MEDICAL/SURGICAL HISTORY:  History reviewed. No pertinent past medical history. Past Surgical History:  Procedure Laterality Date  . APPENDECTOMY  1984     SOCIAL HISTORY:  Social History   Socioeconomic History  . Marital status: Widowed    Spouse name: Not on file  . Number of children: Not on file  . Years of education: Not on file  . Highest education level: Not on file  Occupational History  . Not on file  Social Needs  . Financial resource strain: Not on file  . Food insecurity:    Worry: Not on file    Inability: Not on file  . Transportation needs:    Medical: Not on file    Non-medical: Not on file  Tobacco Use  . Smoking status: Former Smoker    Packs/day: 0.25    Years: 5.00    Pack years: 1.25    Types: Cigarettes    Last attempt to quit: 11/26/2001    Years since quitting: 17.1  . Smokeless tobacco: Never Used  Substance and Sexual Activity  . Alcohol use: Yes    Frequency: Never    Comment: Occassionally  . Drug use: Never  . Sexual  activity: Not on file  Lifestyle  . Physical activity:    Days per week: Not on file    Minutes per session: Not on file  . Stress: Not on file  Relationships  . Social connections:    Talks on phone: Not on file    Gets together: Not on file    Attends religious service: Not on file    Active member of club or organization: Not on file    Attends meetings of clubs or organizations: Not on file    Relationship status: Not on file  . Intimate partner violence:    Maldonado of current or ex partner: Not on file    Emotionally abused: Not on file    Physically abused: Not on file    Forced sexual activity: Not on file  Other Topics Concern  . Not on file  Social History Narrative  . Not on file    FAMILY HISTORY:  Family History  Problem Relation Age of Onset  . Hypertension Mother   . Diabetes Paternal Grandmother     CURRENT MEDICATIONS:  Outpatient Encounter Medications as of 01/17/2019  Medication Sig  . ESTARYLLA 0.25-35 MG-MCG tablet   . [facility] PAIN RELIEF REGULAR ST 325 MG tablet   . furosemide (LASIX) 20 MG tablet Take 1 tablet (20 mg total) by mouth daily as needed. (Patient not  taking: Reported on 09/09/2018)   No facility-administered encounter medications on file as of 01/17/2019.     ALLERGIES:  No Known Allergies   PHYSICAL EXAM:  ECOG Performance status: 0  Vitals:   01/17/19 1128  BP: (!) 158/84  Pulse: 100  Resp: 16  Temp: 98.5 F (36.9 C)  SpO2: 96%   Filed Weights   01/17/19 1128  Weight: (!) 400 lb 12.8 oz (181.8 kg)    Physical Exam Vitals signs reviewed.  Constitutional:      Appearance: Normal appearance.  Cardiovascular:     Rate and Rhythm: Normal rate and regular rhythm.     Heart sounds: Normal heart sounds.  Pulmonary:     Effort: Pulmonary effort is normal.     Breath sounds: Normal breath sounds.  Abdominal:     General: There is no distension.     Palpations: Abdomen is soft. There is no mass.  Musculoskeletal:         General: No swelling.  Skin:    General: Skin is warm.  Neurological:     General: No focal deficit present.     Mental Status: She is alert and oriented to person, place, and time.  Psychiatric:        Mood and Affect: Mood normal.        Behavior: Behavior normal.      LABORATORY DATA:  I have reviewed the labs as listed.  CBC    Component Value Date/Time   WBC 9.2 01/17/2019 1000   RBC 4.92 01/17/2019 1000   HGB 14.1 01/17/2019 1000   HCT 43.5 01/17/2019 1000   PLT 267 01/17/2019 1000   MCV 88.4 01/17/2019 1000   MCH 28.7 01/17/2019 1000   MCHC 32.4 01/17/2019 1000   RDW 13.2 01/17/2019 1000   LYMPHSABS 2.5 01/17/2019 1000   MONOABS 0.7 01/17/2019 1000   EOSABS 1.2 (H) 01/17/2019 1000   BASOSABS 0.1 01/17/2019 1000   CMP Latest Ref Rng & Units 01/17/2019 09/09/2018 04/24/2018  Glucose 70 - 99 mg/dL 91 95 100(H)  BUN 6 - 20 mg/dL 14 15 9   Creatinine 0.44 - 1.00 mg/dL 0.81 0.80 0.73  Sodium 135 - 145 mmol/L 138 137 138  Potassium 3.5 - 5.1 mmol/L 4.1 4.0 4.4  Chloride 98 - 111 mmol/L 102 104 104  CO2 22 - 32 mmol/L 25 24 26   Calcium 8.9 - 10.3 mg/dL 9.2 9.6 9.3  Total Protein 6.5 - 8.1 g/dL 8.4(H) 8.1 8.0  Total Bilirubin 0.3 - 1.2 mg/dL 0.7 0.4 0.8  Alkaline Phos 38 - 126 U/L 84 42 45  AST 15 - 41 U/L 99(H) 20 19  ALT 0 - 44 U/L 113(H) 15 13       DIAGNOSTIC IMAGING:  I have independently reviewed the scans and discussed with the patient.   I have reviewed Tracey Lick LPN's note and agree with the documentation.  I personally performed a face-to-face visit, made revisions and my assessment and plan is as follows.    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 - Weekly rituximab x4 from 12/13/2017 through 01/03/2018 with improvement in the platelet count.   - Prednisone was tapered off in June 2019.  She is no longer requiring Lasix. -Denies any fevers, night sweats or weight loss in the last 4 months.  Denies any infections or hospitalizations.  Denies any bleeding or easy bruising. -Physical examination did not reveal any lymphadenopathy. - Labs today revealed WBC: 9.2, Hgb: 14.1, and Platelet of 267k. CMP was significant for transamintitis: AST:99, ALT: 113, Bili 0.7. She denies any Tylenol use. No alchol abuse. No new medications. She recently started taking Jani Gravel for immune support. I have asked her to spot taking supplement for now.  - We will re-check LFTs in 3-4 weeks. If labs remain elevated, will recommend abdominal ultrasound to r/o underlying etiology.    2.  Health maintenance: -Her last mammogram was on 02/14/2018, BI-RADS Category 1. -We will schedule her next mammogram next visit.         Orders placed this encounter:  Orders Placed This Encounter  Procedures  . Hepatic function panel      Derek Jack, MD [place] [phone]

## 2019-01-17 NOTE — Patient Instructions (Addendum)
[place] at [facility] Discharge Instructions  You were seen today by Dr. Delton Coombes. He went over your recent lab results. He will see you back in 6 months for labs and follow up.  Please follow up with your PCP about your liver functions.  Thank you for choosing [place] at [facility] to provide your oncology and hematology care.  To afford each patient quality time with our provider, please arrive at least 15 minutes before your scheduled appointment time.   If you have a lab appointment with the [facility] please come in thru the  Main Entrance and check in at the main information desk  You need to re-schedule your appointment should you arrive 10 or more minutes late.  We strive to give you quality time with our providers, and arriving late affects you and other patients whose appointments are after yours.  Also, if you no show three or more times for appointments you may be dismissed from the clinic at the providers discretion.     Again, thank you for choosing [facility].  Our hope is that these requests will decrease the amount of time that you wait before being seen by our physicians.       _____________________________________________________________  Should you have questions after your visit to [facility], please contact our office at [phone] between the hours of 8:00 a.m. and 4:30 p.m.  Voicemails left after 4:00 p.m. will not be returned until the following business day.  For prescription refill requests, have your pharmacy contact our office and allow 72 hours.    Cancer Center Support Programs:   > Cancer Support Group  2nd Tuesday of the month 1pm-2pm, Journey Room

## 2019-01-17 NOTE — Assessment & Plan Note (Addendum)
1.  Immune thrombocytopenia: - Admitted to [facility] on 11/19/2017 with easy bruising and bleeding, platelet count 0, status post IVIG 400 mg/kg/day for 5 days, and prednisone 175 mg daily, decrease to 100 mg daily at discharge -CT scan of the neck, chest, abdomen and pelvis on 11/20/2017 did not reveal any masses or adenopathy or splenomegaly, peripheral smear confirmed from cytopenia with no platelet counts - Platelet count started falling when patient on prednisone 80 mg daily to 31,000. - Weekly rituximab x4 from 12/13/2017 through 01/03/2018 with improvement in the platelet count.   - Prednisone was tapered off in June 2019.  She is no longer requiring Lasix. -Denies any fevers, night sweats or weight loss in the last 4 months.  Denies any infections or hospitalizations.  Denies any bleeding or easy bruising. -Physical examination did not reveal any lymphadenopathy. - Labs today revealed WBC: 9.2, Hgb: 14.1, and Platelet of 267k. CMP was significant for transamintitis: AST:99, ALT: 113, Bili 0.7. She denies any Tylenol use. No alchol abuse. No new medications. She recently started taking Jani Gravel for immune support. I have asked her to spot taking supplement for now.  - We will re-check LFTs in 3-4 weeks. If labs remain elevated, will recommend abdominal ultrasound to r/o underlying etiology.    2.  Health maintenance: -Her last mammogram was on 02/14/2018, BI-RADS Category 1. -We will schedule her next mammogram next visit.

## 2019-02-14 ENCOUNTER — Ambulatory Visit (HOSPITAL_COMMUNITY): Payer: BLUE CROSS/BLUE SHIELD | Admitting: Hematology

## 2019-02-14 ENCOUNTER — Other Ambulatory Visit: Payer: Self-pay

## 2019-02-14 ENCOUNTER — Inpatient Hospital Stay (HOSPITAL_COMMUNITY): Payer: BC Managed Care – PPO | Attending: Hematology

## 2019-02-14 ENCOUNTER — Other Ambulatory Visit (HOSPITAL_COMMUNITY): Payer: BLUE CROSS/BLUE SHIELD

## 2019-02-14 DIAGNOSIS — R945 Abnormal results of liver function studies: Secondary | ICD-10-CM

## 2019-02-14 DIAGNOSIS — R7989 Other specified abnormal findings of blood chemistry: Secondary | ICD-10-CM

## 2019-02-14 DIAGNOSIS — D693 Immune thrombocytopenic purpura: Secondary | ICD-10-CM | POA: Insufficient documentation

## 2019-02-14 LAB — HEPATIC FUNCTION PANEL
ALT: 35 U/L (ref 0–44)
AST: 36 U/L (ref 15–41)
Albumin: 3.4 g/dL — ABNORMAL LOW (ref 3.5–5.0)
Alkaline Phosphatase: 82 U/L (ref 38–126)
Bilirubin, Direct: 0.1 mg/dL (ref 0.0–0.2)
Indirect Bilirubin: 0.8 mg/dL (ref 0.3–0.9)
Total Bilirubin: 0.9 mg/dL (ref 0.3–1.2)
Total Protein: 8.1 g/dL (ref 6.5–8.1)

## 2019-02-17 ENCOUNTER — Telehealth (HOSPITAL_COMMUNITY): Payer: Self-pay | Admitting: *Deleted

## 2019-02-17 NOTE — Telephone Encounter (Signed)
Patient called into clinic wanting to know her liver level lab results. Provider reviewed labs and stated labs results were normal. Called patient back to let her know what the provider stated. Patient verbalized understanding.

## 2019-06-15 IMAGING — DX DG CHEST 2V
2 series · 2 of 2 positions shown · non-contrast
Comparison: None.

CLINICAL DATA: Dyspnea on exertion, dry cough

EXAM:
CHEST - 2 VIEW

[chest pa]
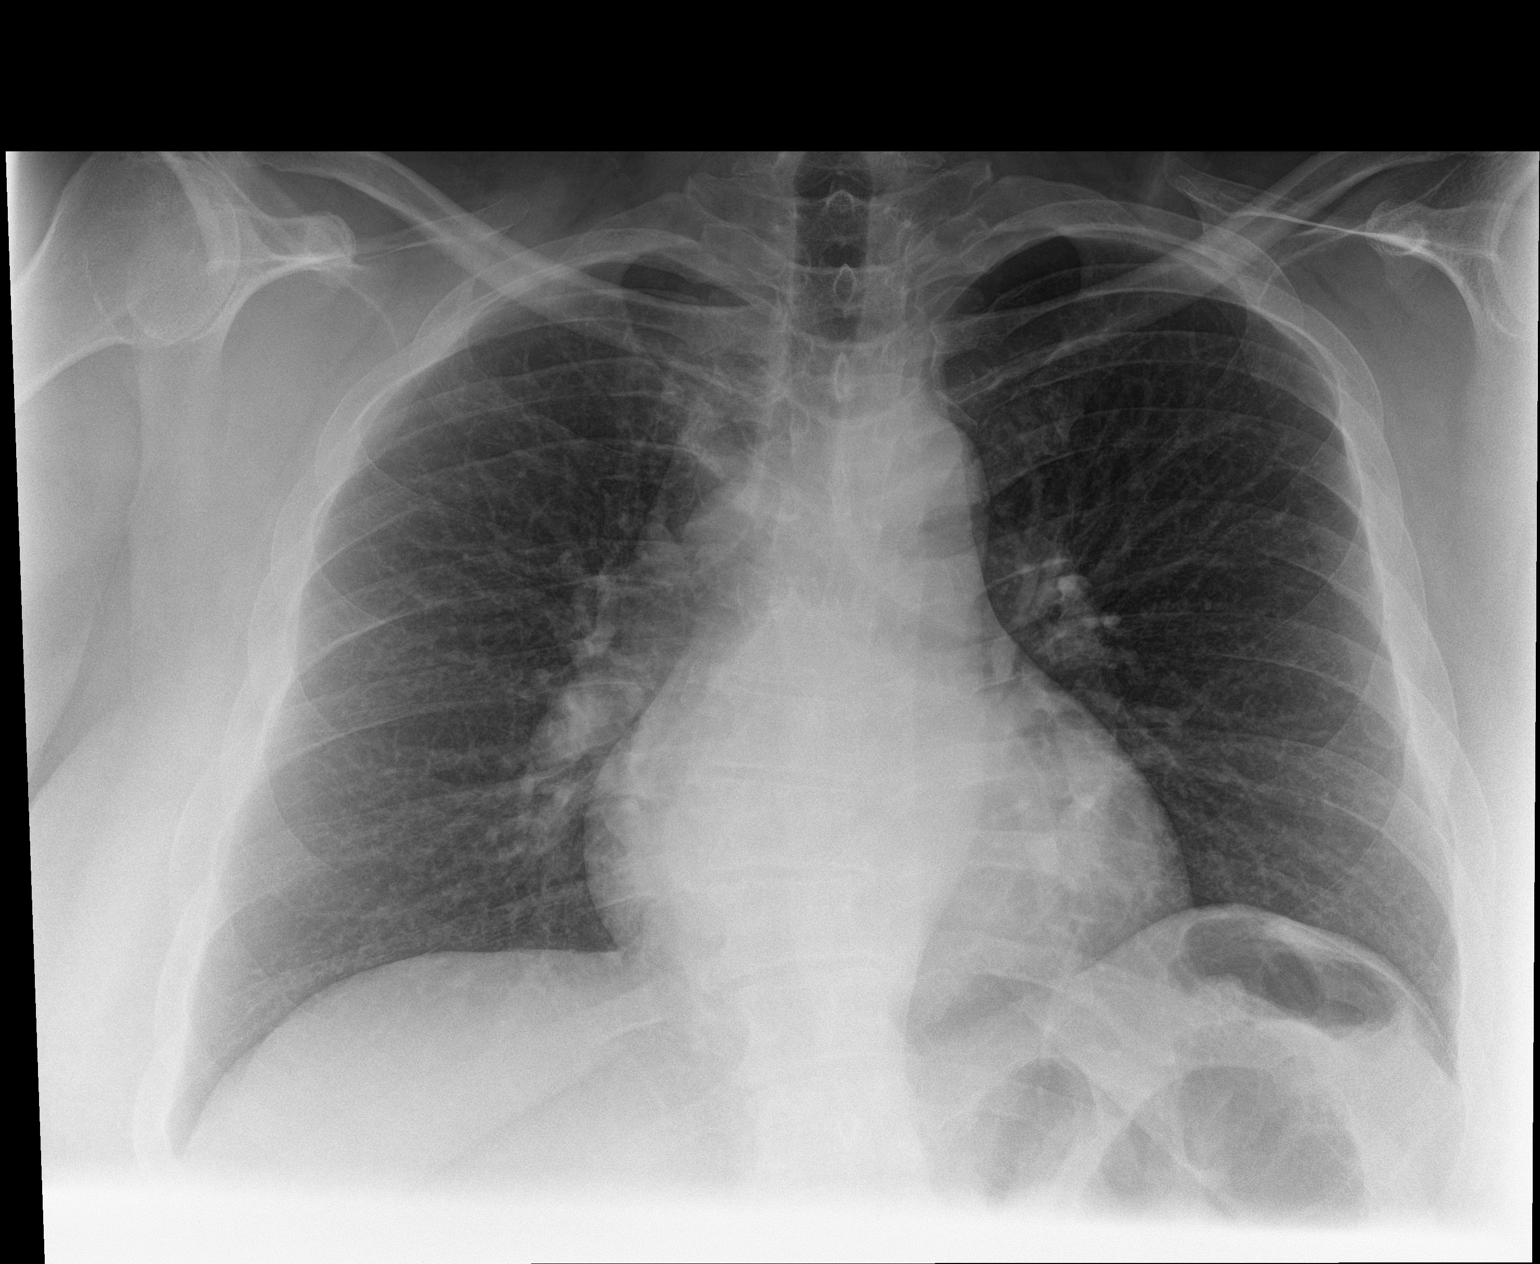

[chest lat]
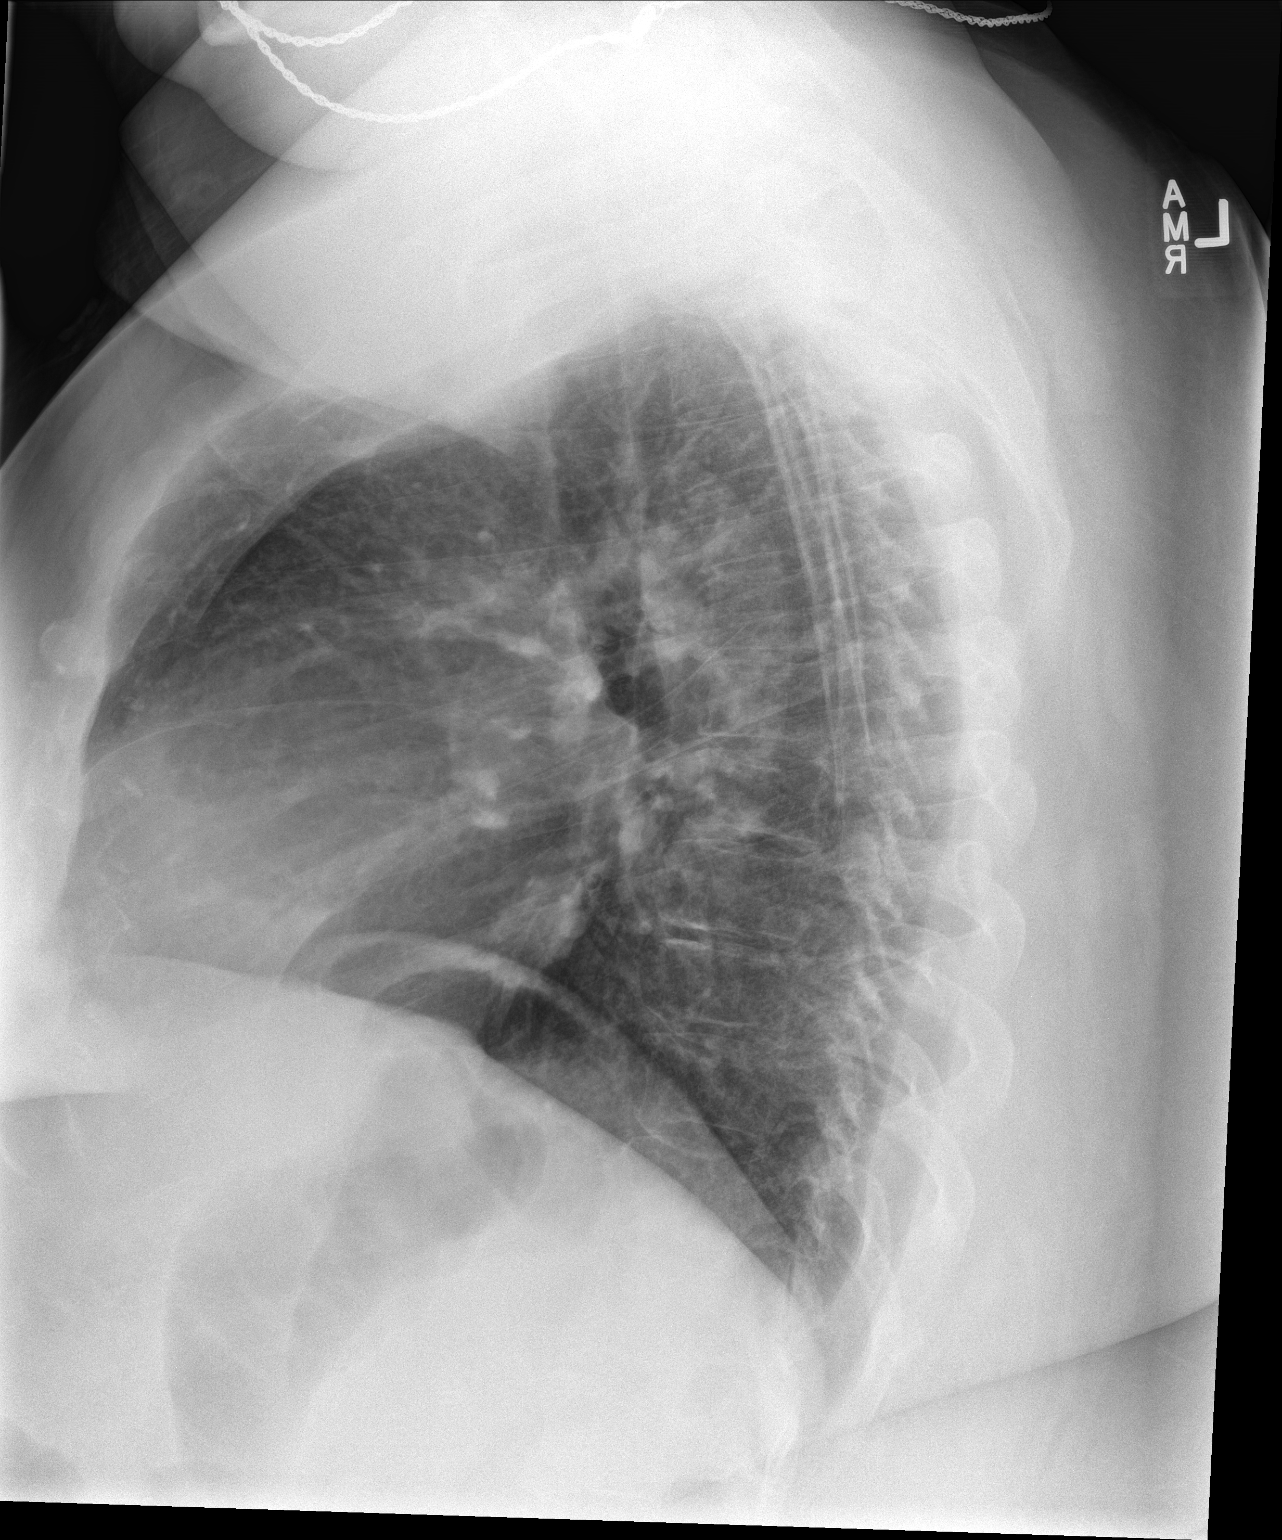

[2 of 2 positions shown; findings below may reference images not displayed]

FINDINGS: Normal heart size. Normal mediastinal contour. No pneumothorax. No
pleural effusion. Lungs appear clear, with no acute consolidative
airspace disease and no pulmonary edema.
IMPRESSION: No active cardiopulmonary disease.

## 2019-07-16 ENCOUNTER — Other Ambulatory Visit (HOSPITAL_COMMUNITY): Payer: Self-pay | Admitting: *Deleted

## 2019-07-16 DIAGNOSIS — R945 Abnormal results of liver function studies: Secondary | ICD-10-CM

## 2019-07-16 DIAGNOSIS — D693 Immune thrombocytopenic purpura: Secondary | ICD-10-CM

## 2019-07-16 DIAGNOSIS — R7989 Other specified abnormal findings of blood chemistry: Secondary | ICD-10-CM

## 2019-07-21 ENCOUNTER — Encounter (HOSPITAL_COMMUNITY): Payer: Self-pay | Admitting: Nurse Practitioner

## 2019-07-21 ENCOUNTER — Other Ambulatory Visit: Payer: Self-pay

## 2019-07-21 ENCOUNTER — Inpatient Hospital Stay (HOSPITAL_COMMUNITY): Payer: BC Managed Care – PPO

## 2019-07-21 ENCOUNTER — Inpatient Hospital Stay (HOSPITAL_COMMUNITY): Payer: BC Managed Care – PPO | Attending: Hematology | Admitting: Nurse Practitioner

## 2019-07-21 VITALS — BP 142/66 | HR 122 | Temp 97.6°F | Resp 18 | Wt 399.0 lb

## 2019-07-21 DIAGNOSIS — D693 Immune thrombocytopenic purpura: Secondary | ICD-10-CM | POA: Diagnosis not present

## 2019-07-21 DIAGNOSIS — Z79899 Other long term (current) drug therapy: Secondary | ICD-10-CM | POA: Insufficient documentation

## 2019-07-21 DIAGNOSIS — R7989 Other specified abnormal findings of blood chemistry: Secondary | ICD-10-CM

## 2019-07-21 DIAGNOSIS — Z1231 Encounter for screening mammogram for malignant neoplasm of breast: Secondary | ICD-10-CM

## 2019-07-21 DIAGNOSIS — Z87891 Personal history of nicotine dependence: Secondary | ICD-10-CM | POA: Insufficient documentation

## 2019-07-21 DIAGNOSIS — Z8249 Family history of ischemic heart disease and other diseases of the circulatory system: Secondary | ICD-10-CM | POA: Insufficient documentation

## 2019-07-21 DIAGNOSIS — R945 Abnormal results of liver function studies: Secondary | ICD-10-CM

## 2019-07-21 LAB — CBC WITH DIFFERENTIAL/PLATELET
Abs Immature Granulocytes: 0.18 10*3/uL — ABNORMAL HIGH (ref 0.00–0.07)
Basophils Absolute: 0.2 10*3/uL — ABNORMAL HIGH (ref 0.0–0.1)
Basophils Relative: 1 %
Eosinophils Absolute: 4.5 10*3/uL — ABNORMAL HIGH (ref 0.0–0.5)
Eosinophils Relative: 21 %
HCT: 26.7 % — ABNORMAL LOW (ref 36.0–46.0)
Hemoglobin: 9.9 g/dL — ABNORMAL LOW (ref 12.0–15.0)
Immature Granulocytes: 1 %
Lymphocytes Relative: 23 %
Lymphs Abs: 5 10*3/uL — ABNORMAL HIGH (ref 0.7–4.0)
MCH: 42.5 pg — ABNORMAL HIGH (ref 26.0–34.0)
MCHC: 37.1 g/dL — ABNORMAL HIGH (ref 30.0–36.0)
MCV: 114.6 fL — ABNORMAL HIGH (ref 80.0–100.0)
Monocytes Absolute: 1.4 10*3/uL — ABNORMAL HIGH (ref 0.1–1.0)
Monocytes Relative: 6 %
Neutro Abs: 10.4 10*3/uL — ABNORMAL HIGH (ref 1.7–7.7)
Neutrophils Relative %: 48 %
Platelets: 350 10*3/uL (ref 150–400)
RBC: 2.33 MIL/uL — ABNORMAL LOW (ref 3.87–5.11)
RDW: 24.9 % — ABNORMAL HIGH (ref 11.5–15.5)
WBC: 21.6 10*3/uL — ABNORMAL HIGH (ref 4.0–10.5)
nRBC: 0.5 % — ABNORMAL HIGH (ref 0.0–0.2)

## 2019-07-21 LAB — FOLATE: Folate: 8.9 ng/mL (ref >5.9)

## 2019-07-21 LAB — COMPREHENSIVE METABOLIC PANEL
ALT: 107 U/L — ABNORMAL HIGH (ref 0–44)
AST: 70 U/L — ABNORMAL HIGH (ref 15–41)
Albumin: 3.3 g/dL — ABNORMAL LOW (ref 3.5–5.0)
Alkaline Phosphatase: 187 U/L — ABNORMAL HIGH (ref 38–126)
Anion gap: 11 (ref 5–15)
BUN: 12 mg/dL (ref 6–20)
CO2: 20 mmol/L — ABNORMAL LOW (ref 22–32)
Calcium: 8.6 mg/dL — ABNORMAL LOW (ref 8.9–10.3)
Chloride: 99 mmol/L (ref 98–111)
Creatinine, Ser: 0.8 mg/dL (ref 0.44–1.00)
GFR calc Af Amer: >60 mL/min (ref >60)
GFR calc non Af Amer: >60 mL/min (ref >60)
Glucose, Bld: 98 mg/dL (ref 70–99)
Potassium: 3.6 mmol/L (ref 3.5–5.1)
Sodium: 130 mmol/L — ABNORMAL LOW (ref 135–145)
Total Bilirubin: 2.4 mg/dL — ABNORMAL HIGH (ref 0.3–1.2)
Total Protein: 9.5 g/dL — ABNORMAL HIGH (ref 6.5–8.1)

## 2019-07-21 LAB — RETICULOCYTES
Immature Retic Fract: 32.8 % — ABNORMAL HIGH (ref 2.3–15.9)
RBC.: 2.29 MIL/uL — ABNORMAL LOW (ref 3.87–5.11)
Retic Count, Absolute: 292.7 10*3/uL — ABNORMAL HIGH (ref 19.0–186.0)
Retic Ct Pct: 12.8 % — ABNORMAL HIGH (ref 0.4–3.1)

## 2019-07-21 LAB — VITAMIN B12: Vitamin B-12: 405 pg/mL (ref 180–914)

## 2019-07-21 LAB — LACTATE DEHYDROGENASE: LDH: 255 U/L — ABNORMAL HIGH (ref 98–192)

## 2019-07-21 LAB — BILIRUBIN, DIRECT: Bilirubin, Direct: 0.6 mg/dL — ABNORMAL HIGH (ref 0.0–0.2)

## 2019-07-21 NOTE — Assessment & Plan Note (Addendum)
1.  Immune thrombocytopenia: -Admitted to [facility] on 11/19/2017 with easy bruising and bleeding, platelet count 0, status post IVIG 400 mg/kg/day for 5 days, and prednisone 175 mg daily, decreased to 100 mg daily at discharge. -CT of the neck, chest, abdomen and pelvis on 11/20/2017 did not reveal any masses or adenopathy or splenomegaly, peripheral smear confirmed with cytopenia with no platelet count. -Platelet count started falling when patient was on prednisone 80 mg daily to 31,000. -Weekly rituximab x4 from 12/13/2017 through 01/03/2018 with improvement in the platelet count. -Prednisone was tapered off June 2019.  She is no longer requiring Lasix. -Denies any fevers, night sweats, chills or weight loss in the last 4 months.  Denies any infections or hospitalizations.  Denies any bleeding or easy bruising. -Physical examination not reveal any lymphadenopathy. -Labs on 07/21/2019 showed alkaline phosphatase 187, AST 70, ALT 107, total bilirubin 2.4, LDH 255, WBC 21.6, hemoglobin 9.9, MCV 114.6, platelets 350 -We will send her down to the lab today to draw some additional labs and call her tomorrow with follow-up.  2.  Health maintenance: -Her last mammogram was on 02/14/2018 was B RADS category 1 -We will schedule her for her mammogram for this year.

## 2019-07-21 NOTE — Progress Notes (Signed)
[place], [place] [address]   CLINIC:  Medical Oncology/Hematology  PCP:  Emelda Fear, DO [address] [place] [address] [phone]   REASON FOR VISIT: Follow-up for immune thrombocytopenia  CURRENT THERAPY: Observation    INTERVAL HISTORY:  Ms. Buggs 43 y.o. female returns for routine follow-up for immune thrombocytopenia.  Patient reports she has been more fatigued than normal.  She denies any bright red bleeding per rectum or melena.  She denies any abdominal pain. Denies any nausea, vomiting, or diarrhea. Denies any new pains. Had not noticed any recent bleeding such as epistaxis, hematuria or hematochezia. Denies recent chest pain on exertion, shortness of breath on minimal exertion, pre-syncopal episodes, or palpitations. Denies any numbness or tingling in hands or feet. Denies any recent fevers, infections, or recent hospitalizations. Patient reports appetite at 100% and energy level at 50%.  She is eating well maintain her weight at this time.    REVIEW OF SYSTEMS:  Review of Systems  Constitutional: Positive for fatigue.  All other systems reviewed and are negative.    PAST MEDICAL/SURGICAL HISTORY:  History reviewed. No pertinent past medical history. Past Surgical History:  Procedure Laterality Date  . APPENDECTOMY  1984     SOCIAL HISTORY:  Social History   Socioeconomic History  . Marital status: Widowed    Spouse name: Not on file  . Number of children: Not on file  . Years of education: Not on file  . Highest education level: Not on file  Occupational History  . Not on file  Social Needs  . Financial resource strain: Not on file  . Food insecurity    Worry: Not on file    Inability: Not on file  . Transportation needs    Medical: Not on file    Non-medical: Not on file  Tobacco Use  . Smoking status: Former Smoker    Packs/day: 0.25    Years: 5.00    Pack years: 1.25    Types: Cigarettes    Quit date: 11/26/2001    Years since quitting: 17.6  . Smokeless tobacco: Never Used  Substance and Sexual Activity  . Alcohol use: Yes    Frequency: Never    Comment: Occassionally  . Drug use: Never  . Sexual activity: Not on file  Lifestyle  . Physical activity    Days per week: Not on file    Minutes per session: Not on file  . Stress: Not on file  Relationships  . Social Herbalist on phone: Not on file    Gets together: Not on file    Attends religious service: Not on file    Active member of club or organization: Not on file    Attends meetings of clubs or organizations: Not on file    Relationship status: Not on file  . Intimate partner violence    Fear of current or ex partner: Not on file    Emotionally abused: Not on file    Physically abused: Not on file    Forced sexual activity: Not on file  Other Topics Concern  . Not on file  Social History Narrative  . Not on file    FAMILY HISTORY:  Family History  Problem Relation Age of Onset  . Hypertension Mother   . Diabetes Paternal Grandmother     CURRENT MEDICATIONS:  Outpatient Encounter Medications as of 07/21/2019  Medication Sig  . ESTARYLLA 0.25-35 MG-MCG tablet   .  [facility] PAIN RELIEF [facility]ULAR ST 325 MG tablet   . furosemide (LASIX) 20 MG tablet Take 1 tablet (20 mg total) by mouth daily as needed. (Patient not taking: Reported on 09/09/2018)   No facility-administered encounter medications on file as of 07/21/2019.     ALLERGIES:  No Known Allergies   PHYSICAL EXAM:  ECOG Performance status: 1  Vitals:   07/21/19 1352  BP: (!) 142/66  Pulse: (!) 122  Resp: 18  Temp: 97.6 F (36.4 C)  SpO2: 100%   Filed Weights   07/21/19 1352  Weight: (!) 399 lb (181 kg)    Physical Exam Constitutional:      Appearance: She is obese.  Cardiovascular:     Rate and Rhythm: Normal rate and regular rhythm.     Heart sounds: Normal heart sounds.  Pulmonary:     Effort: Pulmonary effort is  normal.     Breath sounds: Normal breath sounds.  Abdominal:     General: Bowel sounds are normal.     Palpations: Abdomen is soft.  Musculoskeletal: Normal range of motion.  Skin:    General: Skin is warm.  Neurological:     Mental Status: She is alert and oriented to person, place, and time. Mental status is at baseline.  Psychiatric:        Mood and Affect: Mood normal.        Behavior: Behavior normal.        Thought Content: Thought content normal.        Judgment: Judgment normal.      LABORATORY DATA:  I have reviewed the labs as listed.  CBC    Component Value Date/Time   WBC 21.6 (H) 07/21/2019 1319   RBC 2.33 (L) 07/21/2019 1319   HGB 9.9 (L) 07/21/2019 1319   HCT 26.7 (L) 07/21/2019 1319   PLT 350 07/21/2019 1319   MCV 114.6 (H) 07/21/2019 1319   MCH 42.5 (H) 07/21/2019 1319   MCHC 37.1 (H) 07/21/2019 1319   RDW 24.9 (H) 07/21/2019 1319   LYMPHSABS 5.0 (H) 07/21/2019 1319   MONOABS 1.4 (H) 07/21/2019 1319   EOSABS 4.5 (H) 07/21/2019 1319   BASOSABS 0.2 (H) 07/21/2019 1319   CMP Latest Ref Rng & Units 07/21/2019 02/14/2019 01/17/2019  Glucose 70 - 99 mg/dL 98 - 91  BUN 6 - 20 mg/dL 12 - 14  Creatinine 0.44 - 1.00 mg/dL 0.80 - 0.81  Sodium 135 - 145 mmol/L 130(L) - 138  Potassium 3.5 - 5.1 mmol/L 3.6 - 4.1  Chloride 98 - 111 mmol/L 99 - 102  CO2 22 - 32 mmol/L 20(L) - 25  Calcium 8.9 - 10.3 mg/dL 8.6(L) - 9.2  Total Protein 6.5 - 8.1 g/dL 9.5(H) 8.1 8.4(H)  Total Bilirubin 0.3 - 1.2 mg/dL 2.4(H) 0.9 0.7  Alkaline Phos 38 - 126 U/L 187(H) 82 84  AST 15 - 41 U/L 70(H) 36 99(H)  ALT 0 - 44 U/L 107(H) 35 113(H)    I personally performed a face-to-face visit.  All questions were answered to patient's stated satisfaction. Encouraged patient to call with any new concerns or questions before his next visit to the cancer center and we can certain see him sooner, if needed.     ASSESSMENT & PLAN:   Idiopathic thrombocytopenic purpura (ITP) (HCC) 1.  Immune  thrombocytopenia: -Admitted to [facility] on 11/19/2017 with easy bruising and bleeding, platelet count 0, status post IVIG 400 mg/kg/day for 5 days, and prednisone 175 mg  daily, decreased to 100 mg daily at discharge. -CT of the neck, chest, abdomen and pelvis on 11/20/2017 did not reveal any masses or adenopathy or splenomegaly, peripheral smear confirmed with cytopenia with no platelet count. -Platelet count started falling when patient was on prednisone 80 mg daily to 31,000. -Weekly rituximab x4 from 12/13/2017 through 01/03/2018 with improvement in the platelet count. -Prednisone was tapered off June 2019.  She is no longer requiring Lasix. -Denies any fevers, night sweats, chills or weight loss in the last 4 months.  Denies any infections or hospitalizations.  Denies any bleeding or easy bruising. -Physical examination not reveal any lymphadenopathy. -Labs on 07/21/2019 showed alkaline phosphatase 187, AST 70, ALT 107, total bilirubin 2.4, LDH 255, WBC 21.6, hemoglobin 9.9, MCV 114.6, platelets 350 -We will send her down to the lab today to draw some additional labs and call her tomorrow with follow-up.  2.  Health maintenance: -Her last mammogram was on 02/14/2018 was B RADS category 1 -We will schedule her for her mammogram for this year.      Orders placed this encounter:  Orders Placed This Encounter  Procedures  . MM 3D SCREEN BREAST BILATERAL  . Lactate dehydrogenase  . Vitamin B12  . Folate  . Methylmalonic acid, serum  . Reticulocytes  . Bilirubin,Direct/Indirect(Fractionated)  . Bilirubin, direct  . CBC with Differential  . Direct antiglobulin test      Francene Finders, FNP-C [place] [phone]

## 2019-07-22 ENCOUNTER — Other Ambulatory Visit (HOSPITAL_COMMUNITY): Payer: Self-pay | Admitting: Nurse Practitioner

## 2019-07-22 ENCOUNTER — Inpatient Hospital Stay (HOSPITAL_COMMUNITY): Payer: BC Managed Care – PPO | Attending: Hematology | Admitting: Nurse Practitioner

## 2019-07-22 DIAGNOSIS — Z79899 Other long term (current) drug therapy: Secondary | ICD-10-CM | POA: Insufficient documentation

## 2019-07-22 DIAGNOSIS — Z8249 Family history of ischemic heart disease and other diseases of the circulatory system: Secondary | ICD-10-CM | POA: Insufficient documentation

## 2019-07-22 DIAGNOSIS — D693 Immune thrombocytopenic purpura: Secondary | ICD-10-CM | POA: Diagnosis not present

## 2019-07-22 DIAGNOSIS — Z7952 Long term (current) use of systemic steroids: Secondary | ICD-10-CM | POA: Insufficient documentation

## 2019-07-22 DIAGNOSIS — Z833 Family history of diabetes mellitus: Secondary | ICD-10-CM | POA: Insufficient documentation

## 2019-07-22 DIAGNOSIS — D591 Autoimmune hemolytic anemia, unspecified: Secondary | ICD-10-CM | POA: Insufficient documentation

## 2019-07-22 DIAGNOSIS — Z87891 Personal history of nicotine dependence: Secondary | ICD-10-CM | POA: Diagnosis not present

## 2019-07-22 MED ORDER — FOLIC ACID 1 MG PO TABS: 1.0000 mg | ORAL_TABLET | Freq: Every day | ORAL | 3 refills | Status: DC

## 2019-07-22 MED ORDER — PREDNISONE 20 MG PO TABS: 160.0000 mg | ORAL_TABLET | Freq: Every day | ORAL | 0 refills | Status: DC

## 2019-07-22 NOTE — Assessment & Plan Note (Signed)
1.  Immune thrombocytopenia: -Admitted to [facility] on 11/19/2017 with easy bruising and bleeding, platelet count 0, status post IVIG 400 mg/kg/day for 5 days, and prednisone 175 mg daily, decreased to 100 mg daily at discharge. -CT of the neck, chest, abdomen and pelvis on 11/20/2017 did not reveal any masses or adenopathy or splenomegaly, peripheral smear confirmed with cytopenia with no platelet count. -Platelet count started falling when patient was on prednisone 80 mg daily to 31,000. -Weekly rituximab x4 from 12/13/2017 through 01/03/2018 with improvement in the platelet count. -Prednisone was tapered off June 2019.  She is no longer requiring Lasix. -Denies any fevers, night sweats, chills or weight loss in the last 4 months.  Denies any infections or hospitalizations.  Denies any bleeding or easy bruising. -Physical examination not reveal any lymphadenopathy. -Labs on 07/21/2019 showed alkaline phosphatase 187, AST 70, ALT 107, total bilirubin 2.4, LDH 255, WBC 21.6, hemoglobin 9.9, MCV 114.6, platelets 350 -Additional labs were done on the afternoon of 07/21/2019 which showed direct bilirubin 0.6, reticulocyte 12.8% -We will start her on prednisone 160 mg daily.  Folic acid 1 mg daily -She will follow-up on 07/28/2019 with labs.  2.  Health maintenance: -Her last mammogram was on 02/14/2018 was B RADS category 1 -We will schedule her for her mammogram for this year.

## 2019-07-22 NOTE — Progress Notes (Signed)
[place], [place] [address]   CLINIC:  Medical Oncology/Hematology  PCP:  Emelda Fear, DO [address] [place] [address] [phone]   REASON FOR VISIT: Follow-up for hemolytic anemia  CURRENT THERAPY: Prednisone and folic acid   INTERVAL HISTORY:  Tracey Maldonado 43 y.o. female was called for a telephone visit due to her newly diagnosed hemolytic anemia.  Patient reports she is fatigued otherwise unchanged from yesterday. Denies any nausea, vomiting, or diarrhea. Denies any new pains. Had not noticed any recent bleeding such as epistaxis, hematuria or hematochezia. Denies recent chest pain on exertion, shortness of breath on minimal exertion, pre-syncopal episodes, or palpitations. Denies any numbness or tingling in hands or feet. Denies any recent fevers, infections, or recent hospitalizations.      REVIEW OF SYSTEMS:  Review of Systems  Constitutional: Positive for fatigue.  All other systems reviewed and are negative.    PAST MEDICAL/SURGICAL HISTORY:  No past medical history on file. Past Surgical History:  Procedure Laterality Date  . APPENDECTOMY  1984     SOCIAL HISTORY:  Social History   Socioeconomic History  . Marital status: Widowed    Spouse name: Not on file  . Number of children: Not on file  . Years of education: Not on file  . Highest education level: Not on file  Occupational History  . Not on file  Social Needs  . Financial resource strain: Not on file  . Food insecurity    Worry: Not on file    Inability: Not on file  . Transportation needs    Medical: Not on file    Non-medical: Not on file  Tobacco Use  . Smoking status: Former Smoker    Packs/day: 0.25    Years: 5.00    Pack years: 1.25    Types: Cigarettes    Quit date: 11/26/2001    Years since quitting: 17.6  . Smokeless tobacco: Never Used  Substance and Sexual Activity  . Alcohol use: Yes    Frequency: Never    Comment:  Occassionally  . Drug use: Never  . Sexual activity: Not on file  Lifestyle  . Physical activity    Days per week: Not on file    Minutes per session: Not on file  . Stress: Not on file  Relationships  . Social Herbalist on phone: Not on file    Gets together: Not on file    Attends religious service: Not on file    Active member of club or organization: Not on file    Attends meetings of clubs or organizations: Not on file    Relationship status: Not on file  . Intimate partner violence    Fear of current or ex partner: Not on file    Emotionally abused: Not on file    Physically abused: Not on file    Forced sexual activity: Not on file  Other Topics Concern  . Not on file  Social History Narrative  . Not on file    FAMILY HISTORY:  Family History  Problem Relation Age of Onset  . Hypertension Mother   . Diabetes Paternal Grandmother     CURRENT MEDICATIONS:  Outpatient Encounter Medications as of 07/22/2019  Medication Sig  . [facility] PAIN RELIEF REGULAR ST 325 MG tablet   . ESTARYLLA 0.25-35 MG-MCG tablet   . folic acid (FOLVITE) 1 MG tablet Take 1 tablet (1 mg total) by mouth  daily.  . furosemide (LASIX) 20 MG tablet Take 1 tablet (20 mg total) by mouth daily as needed. (Patient not taking: Reported on 09/09/2018)  . predniSONE (DELTASONE) 20 MG tablet Take 8 tablets (160 mg total) by mouth daily with breakfast.   No facility-administered encounter medications on file as of 07/22/2019.     ALLERGIES:  No Known Allergies  Vital signs: -Deferred due to telephone visit  Physical Exam -Deferred due to telephone visit -Patient was alert and oriented on the phone and in no acute distress.  LABORATORY DATA:  I have reviewed the labs as listed.  CBC    Component Value Date/Time   WBC 21.6 (H) 07/21/2019 1319   RBC 2.29 (L) 07/21/2019 1501   RBC 2.33 (L) 07/21/2019 1319   HGB 9.9 (L) 07/21/2019 1319   HCT 26.7 (L) 07/21/2019 1319   PLT 350 07/21/2019  1319   MCV 114.6 (H) 07/21/2019 1319   MCH 42.5 (H) 07/21/2019 1319   MCHC 37.1 (H) 07/21/2019 1319   RDW 24.9 (H) 07/21/2019 1319   LYMPHSABS 5.0 (H) 07/21/2019 1319   MONOABS 1.4 (H) 07/21/2019 1319   EOSABS 4.5 (H) 07/21/2019 1319   BASOSABS 0.2 (H) 07/21/2019 1319   CMP Latest Ref Rng & Units 07/21/2019 02/14/2019 01/17/2019  Glucose 70 - 99 mg/dL 98 - 91  BUN 6 - 20 mg/dL 12 - 14  Creatinine 0.44 - 1.00 mg/dL 0.80 - 0.81  Sodium 135 - 145 mmol/L 130(L) - 138  Potassium 3.5 - 5.1 mmol/L 3.6 - 4.1  Chloride 98 - 111 mmol/L 99 - 102  CO2 22 - 32 mmol/L 20(L) - 25  Calcium 8.9 - 10.3 mg/dL 8.6(L) - 9.2  Total Protein 6.5 - 8.1 g/dL 9.5(H) 8.1 8.4(H)  Total Bilirubin 0.3 - 1.2 mg/dL 2.4(H) 0.9 0.7  Alkaline Phos 38 - 126 U/L 187(H) 82 84  AST 15 - 41 U/L 70(H) 36 99(H)  ALT 0 - 44 U/L 107(H) 35 113(H)    All questions were answered to patient's stated satisfaction. Encouraged patient to call with any new concerns or questions before his next visit to the cancer center and we can certain see him sooner, if needed.       ASSESSMENT & PLAN:   Idiopathic thrombocytopenic purpura (ITP) (HCC) 1.  Immune thrombocytopenia: -Admitted to [facility] on 11/19/2017 with easy bruising and bleeding, platelet count 0, status post IVIG 400 mg/kg/day for 5 days, and prednisone 175 mg daily, decreased to 100 mg daily at discharge. -CT of the neck, chest, abdomen and pelvis on 11/20/2017 did not reveal any masses or adenopathy or splenomegaly, peripheral smear confirmed with cytopenia with no platelet count. -Platelet count started falling when patient was on prednisone 80 mg daily to 31,000. -Weekly rituximab x4 from 12/13/2017 through 01/03/2018 with improvement in the platelet count. -Prednisone was tapered off June 2019.  She is no longer requiring Lasix. -Denies any fevers, night sweats, chills or weight loss in the last 4 months.  Denies any infections or hospitalizations.  Denies  any bleeding or easy bruising. -Physical examination not reveal any lymphadenopathy. -Labs on 07/21/2019 showed alkaline phosphatase 187, AST 70, ALT 107, total bilirubin 2.4, LDH 255, WBC 21.6, hemoglobin 9.9, MCV 114.6, platelets 350 -Additional labs were done on the afternoon of 07/21/2019 which showed direct bilirubin 0.6, reticulocyte 12.8% -We will start her on prednisone 160 mg daily.  Folic acid 1 mg daily -She will follow-up on 07/28/2019 with labs.  2.  Health maintenance: -Her last mammogram was on 02/14/2018 was B RADS category 1 -We will schedule her for her mammogram for this year.      Orders placed this encounter:  Orders Placed This Encounter  Procedures  . CBC with Differential  . Reticulocytes  . Lactate dehydrogenase  . Hepatic function panel      Francene Finders, FNP-C [place] [phone]

## 2019-07-24 LAB — DIRECT ANTIGLOBULIN TEST (NOT AT ARMC)
DAT, IgG: POSITIVE
DAT, complement: POSITIVE

## 2019-07-24 LAB — METHYLMALONIC ACID, SERUM: Methylmalonic Acid, Quantitative: 92 nmol/L (ref 0–378)

## 2019-07-24 LAB — ABO/RH: ABO/RH(D): A POS

## 2019-07-28 ENCOUNTER — Inpatient Hospital Stay (HOSPITAL_BASED_OUTPATIENT_CLINIC_OR_DEPARTMENT_OTHER): Payer: BC Managed Care – PPO | Admitting: Hematology

## 2019-07-28 ENCOUNTER — Inpatient Hospital Stay (HOSPITAL_COMMUNITY): Payer: BC Managed Care – PPO

## 2019-07-28 ENCOUNTER — Encounter (HOSPITAL_COMMUNITY): Payer: Self-pay | Admitting: Hematology

## 2019-07-28 ENCOUNTER — Other Ambulatory Visit: Payer: Self-pay

## 2019-07-28 DIAGNOSIS — D591 Autoimmune hemolytic anemia, unspecified: Secondary | ICD-10-CM | POA: Diagnosis not present

## 2019-07-28 DIAGNOSIS — Z7952 Long term (current) use of systemic steroids: Secondary | ICD-10-CM | POA: Diagnosis not present

## 2019-07-28 DIAGNOSIS — Z87891 Personal history of nicotine dependence: Secondary | ICD-10-CM | POA: Diagnosis not present

## 2019-07-28 DIAGNOSIS — Z79899 Other long term (current) drug therapy: Secondary | ICD-10-CM | POA: Diagnosis not present

## 2019-07-28 DIAGNOSIS — D693 Immune thrombocytopenic purpura: Secondary | ICD-10-CM | POA: Diagnosis not present

## 2019-07-28 DIAGNOSIS — Z8249 Family history of ischemic heart disease and other diseases of the circulatory system: Secondary | ICD-10-CM | POA: Diagnosis not present

## 2019-07-28 DIAGNOSIS — Z833 Family history of diabetes mellitus: Secondary | ICD-10-CM | POA: Diagnosis not present

## 2019-07-28 LAB — CBC WITH DIFFERENTIAL/PLATELET
Abs Immature Granulocytes: 0.16 10*3/uL — ABNORMAL HIGH (ref 0.00–0.07)
Basophils Absolute: 0.1 10*3/uL (ref 0.0–0.1)
Basophils Relative: 0 %
Eosinophils Absolute: 0.3 10*3/uL (ref 0.0–0.5)
Eosinophils Relative: 1 %
HCT: 32.4 % — ABNORMAL LOW (ref 36.0–46.0)
Hemoglobin: 11 g/dL — ABNORMAL LOW (ref 12.0–15.0)
Immature Granulocytes: 1 %
Lymphocytes Relative: 24 %
Lymphs Abs: 5.1 10*3/uL — ABNORMAL HIGH (ref 0.7–4.0)
MCH: 37.7 pg — ABNORMAL HIGH (ref 26.0–34.0)
MCHC: 34 g/dL (ref 30.0–36.0)
MCV: 111 fL — ABNORMAL HIGH (ref 80.0–100.0)
Monocytes Absolute: 1.5 10*3/uL — ABNORMAL HIGH (ref 0.1–1.0)
Monocytes Relative: 7 %
Neutro Abs: 14 10*3/uL — ABNORMAL HIGH (ref 1.7–7.7)
Neutrophils Relative %: 67 %
Platelets: 365 10*3/uL (ref 150–400)
RBC: 2.92 MIL/uL — ABNORMAL LOW (ref 3.87–5.11)
RDW: 20.5 % — ABNORMAL HIGH (ref 11.5–15.5)
WBC: 21.1 10*3/uL — ABNORMAL HIGH (ref 4.0–10.5)
nRBC: 0.2 % (ref 0.0–0.2)

## 2019-07-28 LAB — RETICULOCYTES
Immature Retic Fract: 24.8 % — ABNORMAL HIGH (ref 2.3–15.9)
RBC.: 2.92 MIL/uL — ABNORMAL LOW (ref 3.87–5.11)
Retic Count, Absolute: 382.2 10*3/uL — ABNORMAL HIGH (ref 19.0–186.0)
Retic Ct Pct: 13.1 % — ABNORMAL HIGH (ref 0.4–3.1)

## 2019-07-28 LAB — HEPATIC FUNCTION PANEL
ALT: 96 U/L — ABNORMAL HIGH (ref 0–44)
AST: 47 U/L — ABNORMAL HIGH (ref 15–41)
Albumin: 3.1 g/dL — ABNORMAL LOW (ref 3.5–5.0)
Alkaline Phosphatase: 110 U/L (ref 38–126)
Bilirubin, Direct: 0.3 mg/dL — ABNORMAL HIGH (ref 0.0–0.2)
Indirect Bilirubin: 1.4 mg/dL — ABNORMAL HIGH (ref 0.3–0.9)
Total Bilirubin: 1.7 mg/dL — ABNORMAL HIGH (ref 0.3–1.2)
Total Protein: 8.1 g/dL (ref 6.5–8.1)

## 2019-07-28 LAB — LACTATE DEHYDROGENASE: LDH: 275 U/L — ABNORMAL HIGH (ref 98–192)

## 2019-07-28 NOTE — Progress Notes (Signed)
[place],  [address]   CLINIC:  Medical Oncology/Hematology  PCP:  Emelda Fear, DO [address] [place] [address] [phone]   REASON FOR VISIT:  Follow-up for autoimmune hemolytic anemia.    INTERVAL HISTORY:  [name] 43 y.o. female seen for follow-up of autoimmune hemolytic anemia.  She developed a sudden drop in hemoglobin last week.  She did not feel very tired.  She denied any bleeding per rectum or melena.  She was started on prednisone 160 mg on 07/21/2019.  She has been tolerating it very well.  She felt very energetic over the weekend and cleaned all her house.  Denies any problems with sleep.  No chest pains or lightheadedness reported.   REVIEW OF SYSTEMS:  Review of Systems  All other systems reviewed and are negative.    PAST MEDICAL/SURGICAL HISTORY:  History reviewed. No pertinent past medical history. Past Surgical History:  Procedure Laterality Date  . APPENDECTOMY  1984     SOCIAL HISTORY:  Social History   Socioeconomic History  . Marital status: Widowed    Spouse name: Not on file  . Number of children: Not on file  . Years of education: Not on file  . Highest education level: Not on file  Occupational History  . Not on file  Social Needs  . Financial resource strain: Not on file  . Food insecurity    Worry: Not on file    Inability: Not on file  . Transportation needs    Medical: Not on file    Non-medical: Not on file  Tobacco Use  . Smoking status: Former Smoker    Packs/day: 0.25    Years: 5.00    Pack years: 1.25    Types: Cigarettes    Quit date: 11/26/2001    Years since quitting: 17.6  . Smokeless tobacco: Never Used  Substance and Sexual Activity  . Alcohol use: Yes    Frequency: Never    Comment: Occassionally  . Drug use: Never  . Sexual activity: Not on file  Lifestyle  . Physical activity    Days per week: Not on file    Minutes per session: Not on file  .  Stress: Not on file  Relationships  . Social Herbalist on phone: Not on file    Gets together: Not on file    Attends religious service: Not on file    Active member of club or organization: Not on file    Attends meetings of clubs or organizations: Not on file    Relationship status: Not on file  . Intimate partner violence    Fear of current or ex partner: Not on file    Emotionally abused: Not on file    Physically abused: Not on file    Forced sexual activity: Not on file  Other Topics Concern  . Not on file  Social History Narrative  . Not on file    FAMILY HISTORY:  Family History  Problem Relation Age of Onset  . Hypertension Mother   . Diabetes Paternal Grandmother     CURRENT MEDICATIONS:  Outpatient Encounter Medications as of 07/28/2019  Medication Sig  . ESTARYLLA 0.25-35 MG-MCG tablet   . folic acid (FOLVITE) 1 MG tablet Take 1 tablet (1 mg total) by mouth daily.  . predniSONE (DELTASONE) 20 MG tablet Take 8 tablets (160 mg total) by mouth daily with breakfast.  . [facility] PAIN  RELIEF REGULAR ST 325 MG tablet   . furosemide (LASIX) 20 MG tablet Take 1 tablet (20 mg total) by mouth daily as needed. (Patient not taking: Reported on 09/09/2018)   No facility-administered encounter medications on file as of 07/28/2019.     ALLERGIES:  No Known Allergies   PHYSICAL EXAM:  ECOG Performance status: 0  Vitals:   07/28/19 1000  BP: (!) 143/72  Pulse: 94  Resp: 20  Temp: (!) 96.9 F (36.1 C)  SpO2: 100%   Filed Weights   07/28/19 1000  Weight: (!) 401 lb 12.8 oz (182.3 kg)    Physical Exam Vitals signs reviewed.  Constitutional:      Appearance: Normal appearance.  Cardiovascular:     Rate and Rhythm: Normal rate and regular rhythm.     Heart sounds: Normal heart sounds.  Pulmonary:     Effort: Pulmonary effort is normal.     Breath sounds: Normal breath sounds.  Abdominal:     General: There is no distension.     Palpations: Abdomen is  soft. There is no mass.  Musculoskeletal:        General: No swelling.  Skin:    General: Skin is warm.  Neurological:     General: No focal deficit present.     Mental Status: She is alert and oriented to person, place, and time.  Psychiatric:        Mood and Affect: Mood normal.        Behavior: Behavior normal.      LABORATORY DATA:  I have reviewed the labs as listed.  CBC    Component Value Date/Time   WBC 21.1 (H) 07/28/2019 0932   RBC 2.92 (L) 07/28/2019 0932   RBC 2.92 (L) 07/28/2019 0932   HGB 11.0 (L) 07/28/2019 0932   HCT 32.4 (L) 07/28/2019 0932   PLT 365 07/28/2019 0932   MCV 111.0 (H) 07/28/2019 0932   MCH 37.7 (H) 07/28/2019 0932   MCHC 34.0 07/28/2019 0932   RDW 20.5 (H) 07/28/2019 0932   LYMPHSABS 5.1 (H) 07/28/2019 0932   MONOABS 1.5 (H) 07/28/2019 0932   EOSABS 0.3 07/28/2019 0932   BASOSABS 0.1 07/28/2019 0932   CMP Latest Ref Rng & Units 07/28/2019 07/21/2019 02/14/2019  Glucose 70 - 99 mg/dL - 98 -  BUN 6 - 20 mg/dL - 12 -  Creatinine 0.44 - 1.00 mg/dL - 0.80 -  Sodium 135 - 145 mmol/L - 130(L) -  Potassium 3.5 - 5.1 mmol/L - 3.6 -  Chloride 98 - 111 mmol/L - 99 -  CO2 22 - 32 mmol/L - 20(L) -  Calcium 8.9 - 10.3 mg/dL - 8.6(L) -  Total Protein 6.5 - 8.1 g/dL 8.1 9.5(H) 8.1  Total Bilirubin 0.3 - 1.2 mg/dL 1.7(H) 2.4(H) 0.9  Alkaline Phos 38 - 126 U/L 110 187(H) 82  AST 15 - 41 U/L 47(H) 70(H) 36  ALT 0 - 44 U/L 96(H) 107(H) 35       DIAGNOSTIC IMAGING:  I have independently reviewed the scans and discussed with the patient.   I have reviewed Venita Lick LPN's note and agree with the documentation.  I personally performed a face-to-face visit, made revisions and my assessment and plan is as follows.    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  Total bilirubin was also elevated consistent  with hemolysis. -She had a [place] test positive confirming it. -She denies any recent upper respiratory infections.  However she felt congested a month ago and had to take Mucinex. -Denies any B symptoms.  Physical exam does not show any palpable adenopathy or splenomegaly. -She was started on prednisone 160 mg on 07/21/2019.  She is tolerating it very well.  She felt improved energy levels over the weekend and worked on her home. -We reviewed blood work from today.  Hemoglobin improved to 11 with slight improvement in bilirubin.  LDH and reticulocyte count remain high. -We will cut back on prednisone to 140 mg starting today.  I will reevaluate her in 1 week.  2.  Immune thrombocytopenia: -Admitted to [facility] on 11/19/2017 with easy bruising, bleeding and platelet count of 0, status post IVIG 400 mg/kg/day for 5 days and prednisone 175 mg daily decrease 200 mg daily at discharge. -CT of the neck, chest, abdomen and pelvis on 11/20/2017 did not show any masses or adenopathy or splenomegaly. -Platelet count started following when prednisone was at 80 mg daily.  She received weekly rituximab x4 from 12/13/2017 through 01/03/2018. -Prednisone was tapered off in June 2019. -Most recent blood work showed her platelet count in the normal range.  3.  Health maintenance: -Last mammogram on 02/14/2018 was BI-RADS Category 1. -We will schedule her mammogram this year.        Orders placed this encounter:  Orders Placed This Encounter  Procedures  . CBC with Differential  . Reticulocytes  . Lactate dehydrogenase  . Hepatic function panel      Derek Jack, MD [place] [phone]

## 2019-07-28 NOTE — Patient Instructions (Addendum)
[place] at [facility] Discharge Instructions  You were seen today by Dr. Delton Coombes. He went over your recent lab results. 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 Start taking 7 tablets of your steroid instead of 8 tablets. He will see you back in 1 week for labs and follow up.   Thank you for choosing [place] at [facility] to provide your oncology and hematology care.  To afford each patient quality time with our provider, please arrive at least 15 minutes before your scheduled appointment time.   If you have a lab appointment with the [place] please come in thru the  Main Entrance and check in at the main information desk  You need to re-schedule your appointment should you arrive 10 or more minutes late.  We strive to give you quality time with our providers, and arriving late affects you and other patients whose appointments are after yours.  Also, if you no show three or more times for appointments you may be dismissed from the clinic at the providers discretion.     Again, thank you for choosing [facility].  Our hope is that these requests will decrease the amount of time that you wait before being seen by our physicians.       _____________________________________________________________  Should you have questions after your visit to [facility], please contact our office at [phone] between the hours of 8:00 a.m. and 4:30 p.m.  Voicemails left after 4:00 p.m. will not be returned until the following business day.  For prescription refill requests, have your pharmacy contact our office and allow 72 hours.    Cancer Center Support Programs:   > Cancer Support Group  2nd Tuesday of the month 1pm-2pm, Journey Room

## 2019-07-28 NOTE — Assessment & Plan Note (Signed)
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  Total bilirubin was also elevated consistent with hemolysis. -She had a Coombs test positive confirming it. -She denies any recent upper respiratory infections.  However she felt congested a month ago and had to take Mucinex. -Denies any B symptoms.  Physical exam does not show any palpable adenopathy or splenomegaly. -She was started on prednisone 160 mg on 07/21/2019.  She is tolerating it very well.  She felt improved energy levels over the weekend and worked on her home. -We reviewed blood work from today.  Hemoglobin improved to 11 with slight improvement in bilirubin.  LDH and reticulocyte count remain high. -We will cut back on prednisone to 140 mg starting today.  I will reevaluate her in 1 week.  2.  Immune thrombocytopenia: -Admitted to [facility] on 11/19/2017 with easy bruising, bleeding and platelet count of 0, status post IVIG 400 mg/kg/day for 5 days and prednisone 175 mg daily decrease 200 mg daily at discharge. -CT of the neck, chest, abdomen and pelvis on 11/20/2017 did not show any masses or adenopathy or splenomegaly. -Platelet count started following when prednisone was at 80 mg daily.  She received weekly rituximab x4 from 12/13/2017 through 01/03/2018. -Prednisone was tapered off in June 2019. -Most recent blood work showed her platelet count in the normal range.  3.  Health maintenance: -Last mammogram on 02/14/2018 was BI-RADS Category 1. -We will schedule her mammogram this year.

## 2019-08-01 ENCOUNTER — Ambulatory Visit (HOSPITAL_COMMUNITY)
Admission: RE | Admit: 2019-08-01 | Discharge: 2019-08-01 | Disposition: A | Discharge status: Home / Self Care | Payer: BC Managed Care – PPO | Source: Ambulatory Visit | Attending: Nurse Practitioner | Admitting: Nurse Practitioner

## 2019-08-01 ENCOUNTER — Other Ambulatory Visit: Payer: Self-pay

## 2019-08-01 DIAGNOSIS — Z1231 Encounter for screening mammogram for malignant neoplasm of breast: Secondary | ICD-10-CM | POA: Diagnosis present

## 2019-08-06 ENCOUNTER — Encounter (HOSPITAL_COMMUNITY): Payer: Self-pay | Admitting: Nurse Practitioner

## 2019-08-06 ENCOUNTER — Other Ambulatory Visit: Payer: Self-pay

## 2019-08-06 ENCOUNTER — Inpatient Hospital Stay (HOSPITAL_COMMUNITY): Payer: BC Managed Care – PPO

## 2019-08-06 ENCOUNTER — Inpatient Hospital Stay (HOSPITAL_BASED_OUTPATIENT_CLINIC_OR_DEPARTMENT_OTHER): Payer: BC Managed Care – PPO | Admitting: Hematology

## 2019-08-06 ENCOUNTER — Encounter (HOSPITAL_COMMUNITY): Payer: Self-pay | Admitting: Hematology

## 2019-08-06 VITALS — BP 141/78 | HR 110 | Temp 97.2°F | Resp 20 | Wt >= 6400 oz

## 2019-08-06 DIAGNOSIS — D591 Autoimmune hemolytic anemia, unspecified: Secondary | ICD-10-CM

## 2019-08-06 LAB — CBC WITH DIFFERENTIAL/PLATELET
Abs Immature Granulocytes: 0.16 10*3/uL — ABNORMAL HIGH (ref 0.00–0.07)
Basophils Absolute: 0.1 10*3/uL (ref 0.0–0.1)
Basophils Relative: 0 %
Eosinophils Absolute: 0.2 10*3/uL (ref 0.0–0.5)
Eosinophils Relative: 1 %
HCT: 41.5 % (ref 36.0–46.0)
Hemoglobin: 13.5 g/dL (ref 12.0–15.0)
Immature Granulocytes: 1 %
Lymphocytes Relative: 17 %
Lymphs Abs: 4.1 10*3/uL — ABNORMAL HIGH (ref 0.7–4.0)
MCH: 34.3 pg — ABNORMAL HIGH (ref 26.0–34.0)
MCHC: 32.5 g/dL (ref 30.0–36.0)
MCV: 105.3 fL — ABNORMAL HIGH (ref 80.0–100.0)
Monocytes Absolute: 1.3 10*3/uL — ABNORMAL HIGH (ref 0.1–1.0)
Monocytes Relative: 6 %
Neutro Abs: 18.3 10*3/uL — ABNORMAL HIGH (ref 1.7–7.7)
Neutrophils Relative %: 75 %
Platelets: 364 10*3/uL (ref 150–400)
RBC: 3.94 MIL/uL (ref 3.87–5.11)
RDW: 15.4 % (ref 11.5–15.5)
WBC: 24.1 10*3/uL — ABNORMAL HIGH (ref 4.0–10.5)
nRBC: 0 % (ref 0.0–0.2)

## 2019-08-06 LAB — HEPATIC FUNCTION PANEL
ALT: 40 U/L (ref 0–44)
AST: 27 U/L (ref 15–41)
Albumin: 3.4 g/dL — ABNORMAL LOW (ref 3.5–5.0)
Alkaline Phosphatase: 73 U/L (ref 38–126)
Bilirubin, Direct: 0.2 mg/dL (ref 0.0–0.2)
Indirect Bilirubin: 0.9 mg/dL (ref 0.3–0.9)
Total Bilirubin: 1.1 mg/dL (ref 0.3–1.2)
Total Protein: 7.7 g/dL (ref 6.5–8.1)

## 2019-08-06 LAB — RETICULOCYTES
Immature Retic Fract: 9.6 % (ref 2.3–15.9)
RBC.: 3.94 MIL/uL (ref 3.87–5.11)
Retic Count, Absolute: 197.8 10*3/uL — ABNORMAL HIGH (ref 19.0–186.0)
Retic Ct Pct: 5 % — ABNORMAL HIGH (ref 0.4–3.1)

## 2019-08-06 LAB — LACTATE DEHYDROGENASE: LDH: 216 U/L — ABNORMAL HIGH (ref 98–192)

## 2019-08-06 NOTE — Progress Notes (Signed)
Tracey Maldonado, [place] [address]   CLINIC:  Medical Oncology/Hematology  PCP:  Emelda Fear, DO [address] [place] [address] [phone]   REASON FOR VISIT:  Follow-up for autoimmune hemolytic anemia.    INTERVAL HISTORY:  Tracey Maldonado 43 y.o. female seen for follow-up of autoimmune hemolytic anemia.  She is currently taking prednisone 140 mg daily.  Energy levels of 100%.  Denies any epigastric pain or distress.  No cough or shortness of breath reported.  No fevers or chills reported.   REVIEW OF SYSTEMS:  Review of Systems  All other systems reviewed and are negative.    PAST MEDICAL/SURGICAL HISTORY:  History reviewed. No pertinent past medical history. Past Surgical History:  Procedure Laterality Date  . APPENDECTOMY  1984     SOCIAL HISTORY:  Social History   Socioeconomic History  . Marital status: Widowed    Spouse name: Not on file  . Number of children: Not on file  . Years of education: Not on file  . Highest education level: Not on file  Occupational History  . Not on file  Tobacco Use  . Smoking status: Former Smoker    Packs/day: 0.25    Years: 5.00    Pack years: 1.25    Types: Cigarettes    Quit date: 11/26/2001    Years since quitting: 17.7  . Smokeless tobacco: Never Used  Substance and Sexual Activity  . Alcohol use: Yes    Comment: Occassionally  . Drug use: Never  . Sexual activity: Not on file  Other Topics Concern  . Not on file  Social History Narrative  . Not on file   Social Determinants of Health   Financial Resource Strain:   . Difficulty of Paying Living Expenses: Not on file  Food Insecurity:   . Worried About Charity fundraiser in the Last Year: Not on file  . Ran Out of Food in the Last Year: Not on file  Transportation Needs:   . Lack of Transportation (Medical): Not on file  . Lack of Transportation (Non-Medical): Not on file  Physical Activity:   . Days of Exercise  per Week: Not on file  . Minutes of Exercise per Session: Not on file  Stress:   . Feeling of Stress : Not on file  Social Connections:   . Frequency of Communication with Friends and Family: Not on file  . Frequency of Social Gatherings with Friends and Family: Not on file  . Attends Religious Services: Not on file  . Active Member of Clubs or Organizations: Not on file  . Attends Archivist Meetings: Not on file  . Marital Status: Not on file  Intimate Partner Violence:   . Fear of Current or Ex-Partner: Not on file  . Emotionally Abused: Not on file  . Physically Abused: Not on file  . Sexually Abused: Not on file    FAMILY HISTORY:  Family History  Problem Relation Age of Onset  . Hypertension Mother   . Diabetes Paternal Grandmother     CURRENT MEDICATIONS:  Outpatient Encounter Medications as of 08/06/2019  Medication Sig  . [employer] PAIN RELIEF REGULAR ST 325 MG tablet   . ESTARYLLA 0.25-35 MG-MCG tablet   . folic acid (FOLVITE) 1 MG tablet Take 1 tablet (1 mg total) by mouth daily.  . furosemide (LASIX) 20 MG tablet Take 1 tablet (20 mg total) by mouth daily as needed.  . [DISCONTINUED]  predniSONE (DELTASONE) 20 MG tablet Take 8 tablets (160 mg total) by mouth daily with breakfast.   No facility-administered encounter medications on file as of 08/06/2019.    ALLERGIES:  No Known Allergies   PHYSICAL EXAM:  ECOG Performance status: 0  Vitals:   08/06/19 0900  BP: (!) 141/78  Pulse: (!) 110  Resp: 20  Temp: (!) 97.2 F (36.2 C)  SpO2: 99%   Filed Weights   08/06/19 0900  Weight: (!) 401 lb (181.9 kg)    Physical Exam Vitals reviewed.  Constitutional:      Appearance: Normal appearance.  Cardiovascular:     Rate and Rhythm: Normal rate and regular rhythm.     Heart sounds: Normal heart sounds.  Pulmonary:     Effort: Pulmonary effort is normal.     Breath sounds: Normal breath sounds.  Abdominal:     General: There is no distension.      Palpations: Abdomen is soft. There is no mass.  Musculoskeletal:        General: No swelling.  Skin:    General: Skin is warm.  Neurological:     General: No focal deficit present.     Mental Status: She is alert and oriented to person, place, and time.  Psychiatric:        Mood and Affect: Mood normal.        Behavior: Behavior normal.      LABORATORY DATA:  I have reviewed the labs as listed.  CBC    Component Value Date/Time   WBC 24.1 (H) 08/06/2019 0835   RBC 3.94 08/06/2019 0835   RBC 3.94 08/06/2019 0835   HGB 13.5 08/06/2019 0835   HCT 41.5 08/06/2019 0835   PLT 364 08/06/2019 0835   MCV 105.3 (H) 08/06/2019 0835   MCH 34.3 (H) 08/06/2019 0835   MCHC 32.5 08/06/2019 0835   RDW 15.4 08/06/2019 0835   LYMPHSABS 4.1 (H) 08/06/2019 0835   MONOABS 1.3 (H) 08/06/2019 0835   EOSABS 0.2 08/06/2019 0835   BASOSABS 0.1 08/06/2019 0835   CMP Latest Ref Rng & Units 08/06/2019 07/28/2019 07/21/2019  Glucose 70 - 99 mg/dL - - 98  BUN 6 - 20 mg/dL - - 12  Creatinine 0.44 - 1.00 mg/dL - - 0.80  Sodium 135 - 145 mmol/L - - 130(L)  Potassium 3.5 - 5.1 mmol/L - - 3.6  Chloride 98 - 111 mmol/L - - 99  CO2 22 - 32 mmol/L - - 20(L)  Calcium 8.9 - 10.3 mg/dL - - 8.6(L)  Total Protein 6.5 - 8.1 g/dL 7.7 8.1 9.5(H)  Total Bilirubin 0.3 - 1.2 mg/dL 1.1 1.7(H) 2.4(H)  Alkaline Phos 38 - 126 U/L 73 110 187(H)  AST 15 - 41 U/L 27 47(H) 70(H)  ALT 0 - 44 U/L 40 96(H) 107(H)       DIAGNOSTIC IMAGING:  I have independently reviewed the scans and discussed with the patient.   I have reviewed Venita Lick LPN's note and agree with the documentation.  I personally performed a face-to-face visit, made revisions and my assessment and plan is as follows.    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  Total bilirubin was also elevated  consistent with hemolysis. -She had a Coombs test positive confirming it. -She denies any recent upper respiratory infections.  However she felt congested a  month ago and had to take Mucinex. -Denies any B symptoms.  Physical exam does not show any palpable adenopathy or splenomegaly. -She was started on prednisone 160 mg on 07/21/2019.  She is currently taking 140 mg daily. -She denies any epigastric pains.  We have reviewed her labs.  Hemoglobin improved to 13.5.  LDH is 215.  Reticulocyte count improved to 5% from 13%. -I have suggested her to cut back on prednisone to 120 mg daily.  We have also discussed addition of rituximab to the prednisone for deeper responses.  However she had rituximab for immune thrombocytopenia which completed in May 2019.  As her blood counts are improving rapidly, I did not recommend rituximab at this time. -We will reevaluate her in 2 weeks for follow-up with labs.  If her hemoglobin is stable and LDH and reticulocyte count continue to improve, I will cut back on prednisone  to100 mg at that time.  2.  Immune thrombocytopenia: -Admitted to [facility] on 11/19/2017 with easy bruising, bleeding and platelet count of 0, status post IVIG 400 mg/kg/day for 5 days and prednisone 175 mg daily decrease 200 mg daily at discharge. -CT of the neck, chest, abdomen and pelvis on 11/20/2017 did not show any masses or adenopathy or splenomegaly. -Platelet count started following when prednisone was at 80 mg daily.  She received weekly rituximab x4 from 12/13/2017 through 01/03/2018. -Prednisone was tapered off in June 2019. -Most recent blood work showed her platelet count in the normal range.  3.  Health maintenance: -Last mammogram on 02/14/2018 was BI-RADS Category 1. -We will schedule her mammogram this year.        Orders placed this encounter:  Orders Placed This Encounter  Procedures  . CBC with Differential/Platelet  . Comprehensive metabolic panel       Derek Jack, MD [place] [phone]

## 2019-08-06 NOTE — Patient Instructions (Addendum)
[place] at [facility] Discharge Instructions  You were seen today by Dr. Delton Coombes. He went over your recent lab results. Decrease the prednisone to 120mg  daily. He will see you back in 2 weeks for labs and follow up.   Thank you for choosing [place] at [facility] to provide your oncology and hematology care.  To afford each patient quality time with our provider, please arrive at least 15 minutes before your scheduled appointment time.   If you have a lab appointment with the [place] please come in thru the  Main Entrance and check in at the main information desk  You need to re-schedule your appointment should you arrive 10 or more minutes late.  We strive to give you quality time with our providers, and arriving late affects you and other patients whose appointments are after yours.  Also, if you no show three or more times for appointments you may be dismissed from the clinic at the providers discretion.     Again, thank you for choosing [facility].  Our hope is that these requests will decrease the amount of time that you wait before being seen by our physicians.       _____________________________________________________________  Should you have questions after your visit to [facility], please contact our office at [phone] between the hours of 8:00 a.m. and 4:30 p.m.  Voicemails left after 4:00 p.m. will not be returned until the following business day.  For prescription refill requests, have your pharmacy contact our office and allow 72 hours.    Cancer Center Support Programs:   > Cancer Support Group  2nd Tuesday of the month 1pm-2pm, Journey Room

## 2019-08-07 ENCOUNTER — Other Ambulatory Visit (HOSPITAL_COMMUNITY): Payer: BC Managed Care – PPO

## 2019-08-07 ENCOUNTER — Other Ambulatory Visit (HOSPITAL_COMMUNITY): Payer: Self-pay | Admitting: *Deleted

## 2019-08-07 ENCOUNTER — Ambulatory Visit (HOSPITAL_COMMUNITY): Payer: BC Managed Care – PPO | Admitting: Hematology

## 2019-08-07 DIAGNOSIS — D693 Immune thrombocytopenic purpura: Secondary | ICD-10-CM

## 2019-08-07 MED ORDER — PREDNISONE 20 MG PO TABS: 120.0000 mg | ORAL_TABLET | Freq: Every day | ORAL | 0 refills | Status: DC

## 2019-08-13 ENCOUNTER — Encounter (HOSPITAL_COMMUNITY): Payer: Self-pay | Admitting: Hematology

## 2019-08-13 NOTE — Assessment & Plan Note (Addendum)
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  Total bilirubin was also elevated consistent with hemolysis. -She had a Coombs test positive confirming it. -She denies any recent upper respiratory infections.  However she felt congested a month ago and had to take Mucinex. -Denies any B symptoms.  Physical exam does not show any palpable adenopathy or splenomegaly. -She was started on prednisone 160 mg on 07/21/2019.  She is currently taking 140 mg daily. -She denies any epigastric pains.  We have reviewed her labs.  Hemoglobin improved to 13.5.  LDH is 215.  Reticulocyte count improved to 5% from 13%. -I have suggested her to cut back on prednisone to 120 mg daily.  We have also discussed addition of rituximab to the prednisone for deeper responses.  However she had rituximab for immune thrombocytopenia which completed in May 2019.  As her blood counts are improving rapidly, I did not recommend rituximab at this time. -We will reevaluate her in 2 weeks for follow-up with labs.  If her hemoglobin is stable and LDH and reticulocyte count continue to improve, I will cut back on prednisone  to100 mg at that time.  2.  Immune thrombocytopenia: -Admitted to [facility] on 11/19/2017 with easy bruising, bleeding and platelet count of 0, status post IVIG 400 mg/kg/day for 5 days and prednisone 175 mg daily decrease 200 mg daily at discharge. -CT of the neck, chest, abdomen and pelvis on 11/20/2017 did not show any masses or adenopathy or splenomegaly. -Platelet count started following when prednisone was at 80 mg daily.  She received weekly rituximab x4 from 12/13/2017 through 01/03/2018. -Prednisone was tapered off in June 2019. -Most recent blood work showed her platelet count in the normal range.  3.  Health maintenance: -Last mammogram on 02/14/2018 was BI-RADS Category 1. -We will schedule her mammogram this  year.

## 2019-08-20 ENCOUNTER — Other Ambulatory Visit: Payer: Self-pay

## 2019-08-20 ENCOUNTER — Inpatient Hospital Stay (HOSPITAL_COMMUNITY): Payer: BC Managed Care – PPO

## 2019-08-20 ENCOUNTER — Inpatient Hospital Stay (HOSPITAL_BASED_OUTPATIENT_CLINIC_OR_DEPARTMENT_OTHER): Payer: BC Managed Care – PPO | Admitting: Nurse Practitioner

## 2019-08-20 DIAGNOSIS — D591 Autoimmune hemolytic anemia, unspecified: Secondary | ICD-10-CM

## 2019-08-20 DIAGNOSIS — D693 Immune thrombocytopenic purpura: Secondary | ICD-10-CM | POA: Diagnosis not present

## 2019-08-20 LAB — CBC WITH DIFFERENTIAL/PLATELET
Abs Immature Granulocytes: 0.08 10*3/uL — ABNORMAL HIGH (ref 0.00–0.07)
Basophils Absolute: 0 10*3/uL (ref 0.0–0.1)
Basophils Relative: 0 %
Eosinophils Absolute: 0 10*3/uL (ref 0.0–0.5)
Eosinophils Relative: 0 %
HCT: 44.7 % (ref 36.0–46.0)
Hemoglobin: 14.3 g/dL (ref 12.0–15.0)
Immature Granulocytes: 1 %
Lymphocytes Relative: 7 %
Lymphs Abs: 1.1 10*3/uL (ref 0.7–4.0)
MCH: 32.9 pg (ref 26.0–34.0)
MCHC: 32 g/dL (ref 30.0–36.0)
MCV: 103 fL — ABNORMAL HIGH (ref 80.0–100.0)
Monocytes Absolute: 0.2 10*3/uL (ref 0.1–1.0)
Monocytes Relative: 2 %
Neutro Abs: 13.5 10*3/uL — ABNORMAL HIGH (ref 1.7–7.7)
Neutrophils Relative %: 90 %
Platelets: 296 10*3/uL (ref 150–400)
RBC: 4.34 MIL/uL (ref 3.87–5.11)
RDW: 13.3 % (ref 11.5–15.5)
WBC: 14.9 10*3/uL — ABNORMAL HIGH (ref 4.0–10.5)
nRBC: 0 % (ref 0.0–0.2)

## 2019-08-20 LAB — COMPREHENSIVE METABOLIC PANEL
ALT: 26 U/L (ref 0–44)
AST: 19 U/L (ref 15–41)
Albumin: 3.6 g/dL (ref 3.5–5.0)
Alkaline Phosphatase: 43 U/L (ref 38–126)
Anion gap: 11 (ref 5–15)
BUN: 16 mg/dL (ref 6–20)
CO2: 27 mmol/L (ref 22–32)
Calcium: 8.9 mg/dL (ref 8.9–10.3)
Chloride: 99 mmol/L (ref 98–111)
Creatinine, Ser: 0.91 mg/dL (ref 0.44–1.00)
GFR calc Af Amer: >60 mL/min (ref >60)
GFR calc non Af Amer: >60 mL/min (ref >60)
Glucose, Bld: 168 mg/dL — ABNORMAL HIGH (ref 70–99)
Potassium: 4.1 mmol/L (ref 3.5–5.1)
Sodium: 137 mmol/L (ref 135–145)
Total Bilirubin: 0.7 mg/dL (ref 0.3–1.2)
Total Protein: 7.7 g/dL (ref 6.5–8.1)

## 2019-08-20 MED ORDER — PREDNISONE 20 MG PO TABS: 100.0000 mg | ORAL_TABLET | Freq: Every day | ORAL | 0 refills | Status: DC

## 2019-08-20 NOTE — Progress Notes (Signed)
[place], [place] [address]   CLINIC:  Medical Oncology/Hematology  PCP:  Emelda Fear, DO [address] [place] [address] [phone]   REASON FOR VISIT: Follow-up for autoimmune hemolytic anemia.  CURRENT THERAPY: Prednisone   INTERVAL HISTORY:  [name] 43 y.o. female returns for routine follow-up for autoimmune hemolytic anemia.  Patient reports she is starting to have swelling due to the prednisone.  She also reports having trouble sleeping from time to time.  Otherwise she is tolerating it well. Denies any nausea, vomiting, or diarrhea. Denies any new pains. Had not noticed any recent bleeding such as epistaxis, hematuria or hematochezia. Denies recent chest pain on exertion, shortness of breath on minimal exertion, pre-syncopal episodes, or palpitations. Denies any numbness or tingling in hands or feet. Denies any recent fevers, infections, or recent hospitalizations. Patient reports appetite at 100% and energy level at 100%.    REVIEW OF SYSTEMS:  Review of Systems  Cardiovascular: Positive for leg swelling.  All other systems reviewed and are negative.    PAST MEDICAL/SURGICAL HISTORY:  No past medical history on file. Past Surgical History:  Procedure Laterality Date  . APPENDECTOMY  1984     SOCIAL HISTORY:  Social History   Socioeconomic History  . Marital status: Widowed    Spouse name: Not on file  . Number of children: Not on file  . Years of education: Not on file  . Highest education level: Not on file  Occupational History  . Not on file  Tobacco Use  . Smoking status: Former Smoker    Packs/day: 0.25    Years: 5.00    Pack years: 1.25    Types: Cigarettes    Quit date: 11/26/2001    Years since quitting: 17.7  . Smokeless tobacco: Never Used  Substance and Sexual Activity  . Alcohol use: Yes    Comment: Occassionally  . Drug use: Never  . Sexual activity: Not on file  Other Topics Concern    . Not on file  Social History Narrative  . Not on file   Social Determinants of Health   Financial Resource Strain:   . Difficulty of Paying Living Expenses: Not on file  Food Insecurity:   . Worried About Charity fundraiser in the Last Year: Not on file  . Ran Out of Food in the Last Year: Not on file  Transportation Needs:   . Lack of Transportation (Medical): Not on file  . Lack of Transportation (Non-Medical): Not on file  Physical Activity:   . Days of Exercise per Week: Not on file  . Minutes of Exercise per Session: Not on file  Stress:   . Feeling of Stress : Not on file  Social Connections:   . Frequency of Communication with Friends and Family: Not on file  . Frequency of Social Gatherings with Friends and Family: Not on file  . Attends Religious Services: Not on file  . Active Member of Clubs or Organizations: Not on file  . Attends Archivist Meetings: Not on file  . Marital Status: Not on file  Intimate Partner Violence:   . Fear of Current or Ex-Partner: Not on file  . Emotionally Abused: Not on file  . Physically Abused: Not on file  . Sexually Abused: Not on file    FAMILY HISTORY:  Family History  Problem Relation Age of Onset  . Hypertension Mother   . Diabetes Paternal Grandmother  CURRENT MEDICATIONS:  Outpatient Encounter Medications as of 08/20/2019  Medication Sig  . ESTARYLLA 0.25-35 MG-MCG tablet Take 1 tablet by mouth daily.   . predniSONE (DELTASONE) 20 MG tablet Take 6 tablets (120 mg total) by mouth daily with breakfast.  . [employer] PAIN RELIEF REGULAR ST 325 MG tablet Take 325 mg by mouth as needed.   . folic acid (FOLVITE) 1 MG tablet Take 1 tablet (1 mg total) by mouth daily. (Patient not taking: Reported on 08/20/2019)  . furosemide (LASIX) 20 MG tablet Take 1 tablet (20 mg total) by mouth daily as needed. (Patient not taking: Reported on 08/20/2019)   No facility-administered encounter medications on file as of 08/20/2019.     ALLERGIES:  No Known Allergies   PHYSICAL EXAM:  ECOG Performance status: 1  Vitals:   08/20/19 1359  BP: (!) 153/93  Pulse: (!) 122  Resp: 18  Temp: (!) 97.4 F (36.3 C)  SpO2: 100%   Filed Weights   08/20/19 1359  Weight: (!) 410 lb 12.8 oz (186.3 kg)    Physical Exam Constitutional:      Appearance: She is obese.  Cardiovascular:     Rate and Rhythm: Normal rate and regular rhythm.     Heart sounds: Normal heart sounds.  Pulmonary:     Effort: Pulmonary effort is normal.     Breath sounds: Normal breath sounds.  Abdominal:     General: Bowel sounds are normal.     Palpations: Abdomen is soft.  Musculoskeletal:        General: Normal range of motion.  Skin:    General: Skin is warm.  Neurological:     Mental Status: She is alert and oriented to person, place, and time. Mental status is at baseline.  Psychiatric:        Mood and Affect: Mood normal.        Behavior: Behavior normal.        Thought Content: Thought content normal.        Judgment: Judgment normal.      LABORATORY DATA:  I have reviewed the labs as listed.  CBC    Component Value Date/Time   WBC 14.9 (H) 08/20/2019 1338   RBC 4.34 08/20/2019 1338   HGB 14.3 08/20/2019 1338   HCT 44.7 08/20/2019 1338   PLT 296 08/20/2019 1338   MCV 103.0 (H) 08/20/2019 1338   MCH 32.9 08/20/2019 1338   MCHC 32.0 08/20/2019 1338   RDW 13.3 08/20/2019 1338   LYMPHSABS 1.1 08/20/2019 1338   MONOABS 0.2 08/20/2019 1338   EOSABS 0.0 08/20/2019 1338   BASOSABS 0.0 08/20/2019 1338   CMP Latest Ref Rng & Units 08/20/2019 08/06/2019 07/28/2019  Glucose 70 - 99 mg/dL 168(H) - -  BUN 6 - 20 mg/dL 16 - -  Creatinine 0.44 - 1.00 mg/dL 0.91 - -  Sodium 135 - 145 mmol/L 137 - -  Potassium 3.5 - 5.1 mmol/L 4.1 - -  Chloride 98 - 111 mmol/L 99 - -  CO2 22 - 32 mmol/L 27 - -  Calcium 8.9 - 10.3 mg/dL 8.9 - -  Total Protein 6.5 - 8.1 g/dL 7.7 7.7 8.1  Total Bilirubin 0.3 - 1.2 mg/dL 0.7 1.1 1.7(H)   Alkaline Phos 38 - 126 U/L 43 73 110  AST 15 - 41 U/L 19 27 47(H)  ALT 0 - 44 U/L 26 40 96(H)     I personally performed a face-to-face visit.  All questions were answered  to patient's stated satisfaction. Encouraged patient to call with any new concerns or questions before his next visit to the cancer center and we can certain see him sooner, if needed.     ASSESSMENT & PLAN:   Idiopathic thrombocytopenic purpura (ITP) (HCC) 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  Total bilirubin was also elevated consistent with hemolysis. -She had a Coombs test positive confirming it. -She denies any recent upper respiratory infections however she was congested a month ago and had to take Mucinex. -She denies any B symptoms.  Physical examination did not show any palpable adenopathy or splenomegaly. -She was started on prednisone 160 mg on 07/21/2019. -She denies any epigastric pain. -She is currently on 120 mg daily.  Her labs are improving.  Hemoglobin is 14.3, LDH is 216. -We will decrease her to 100 mg daily. -We will see her back next week with labs.  2.  Immune thrombocytopenia: -Admitted to [facility] on 11/19/2017 with easy bruising and bleeding, platelet count 0, status post IVIG 400 mg/kg/day for 5 days, and prednisone 175 mg daily, decreased to 100 mg daily at discharge. -CT of the neck, chest, abdomen and pelvis on 11/20/2017 did not reveal any masses or adenopathy or splenomegaly, peripheral smear confirmed with cytopenia with no platelet count. -Platelet count started falling when patient was on prednisone 80 mg daily to 31,000. -Weekly rituximab x4 from 12/13/2017 through 01/03/2018 with improvement in the platelet count. -Prednisone was tapered off June 2019.  She is no longer requiring Lasix. -Denies any fevers, night sweats, chills or weight loss in the last 4 months.  Denies any  infections or hospitalizations.  Denies any bleeding or easy bruising. -Physical examination not reveal any lymphadenopathy. -Labs on 07/21/2019 showed alkaline phosphatase 187, AST 70, ALT 107, total bilirubin 2.4, LDH 255, WBC 21.6, hemoglobin 9.9, MCV 114.6, platelets 350 -Additional labs were done on the afternoon of 07/21/2019 which showed direct bilirubin 0.6, reticulocyte 12.8% -We will start her on prednisone 160 mg daily.  Folic acid 1 mg daily -She will follow-up next week with labs  2.  Health maintenance: -Her last mammogram was on 02/14/2018 was B RADS category 1 -We will schedule her for her mammogram for this year.      Orders placed this encounter:  Orders Placed This Encounter  Procedures  . CBC with Differential/Platelet  . Comprehensive metabolic panel  . Reticulocytes     Francene Finders, FNP-C [employer] [phone]

## 2019-08-20 NOTE — Addendum Note (Signed)
Addended by: [place]ie [place] on: 08/20/2019 02:31 PM   Modules accepted: Orders

## 2019-08-20 NOTE — Patient Instructions (Signed)
[facility] at [facility] Discharge Instructions  Follow up in 1 week with labs    Thank you for choosing [facility] at [facility] to provide your oncology and hematology care.  To afford each patient quality time with our provider, please arrive at least 15 minutes before your scheduled appointment time.   If you have a lab appointment with the Cancer Center please come in thru the Main Entrance and check in at the main information desk.  You need to re-schedule your appointment should you arrive 10 or more minutes late.  We strive to give you quality time with our providers, and arriving late affects you and other patients whose appointments are after yours.  Also, if you no show three or more times for appointments you may be dismissed from the clinic at the providers discretion.     Again, thank you for choosing [facility].  Our hope is that these requests will decrease the amount of time that you wait before being seen by our physicians.       _____________________________________________________________  Should you have questions after your visit to [facility], please contact our office at [phone] between the hours of 8:00 a.m. and 4:30 p.m.  Voicemails left after 4:00 p.m. will not be returned until the following business day.  For prescription refill requests, have your pharmacy contact our office and allow 72 hours.    Due to Covid, you will need to wear a mask upon entering the hospital. If you do not have a mask, a mask will be given to you at the Main Entrance upon arrival. For doctor visits, patients may have 1 support person with them. For treatment visits, patients can not have anyone with them due to social distancing guidelines and our immunocompromised population.      

## 2019-08-20 NOTE — Assessment & Plan Note (Signed)
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  Total bilirubin was also elevated consistent with hemolysis. -She had a Coombs test positive confirming it. -She denies any recent upper respiratory infections however she was congested a month ago and had to take Mucinex. -She denies any B symptoms.  Physical examination did not show any palpable adenopathy or splenomegaly. -She was started on prednisone 160 mg on 07/21/2019. -She denies any epigastric pain. -She is currently on 120 mg daily.  Her labs are improving.  Hemoglobin is 14.3, LDH is 216. -We will decrease her to 100 mg daily. -We will see her back next week with labs.  2.  Immune thrombocytopenia: -Admitted to [facility] on 11/19/2017 with easy bruising and bleeding, platelet count 0, status post IVIG 400 mg/kg/day for 5 days, and prednisone 175 mg daily, decreased to 100 mg daily at discharge. -CT of the neck, chest, abdomen and pelvis on 11/20/2017 did not reveal any masses or adenopathy or splenomegaly, peripheral smear confirmed with cytopenia with no platelet count. -Platelet count started falling when patient was on prednisone 80 mg daily to 31,000. -Weekly rituximab x4 from 12/13/2017 through 01/03/2018 with improvement in the platelet count. -Prednisone was tapered off June 2019.  She is no longer requiring Lasix. -Denies any fevers, night sweats, chills or weight loss in the last 4 months.  Denies any infections or hospitalizations.  Denies any bleeding or easy bruising. -Physical examination not reveal any lymphadenopathy. -Labs on 07/21/2019 showed alkaline phosphatase 187, AST 70, ALT 107, total bilirubin 2.4, LDH 255, WBC 21.6, hemoglobin 9.9, MCV 114.6, platelets 350 -Additional labs were done on the afternoon of 07/21/2019 which showed direct bilirubin 0.6, reticulocyte 12.8% -We will start her on prednisone 160 mg daily.   Folic acid 1 mg daily -She will follow-up next week with labs  2.  Health maintenance: -Her last mammogram was on 02/14/2018 was B RADS category 1 -We will schedule her for her mammogram for this year.

## 2019-08-28 ENCOUNTER — Other Ambulatory Visit (HOSPITAL_COMMUNITY): Payer: BC Managed Care – PPO

## 2019-08-28 ENCOUNTER — Ambulatory Visit (HOSPITAL_COMMUNITY): Payer: BC Managed Care – PPO | Admitting: Nurse Practitioner

## 2019-09-02 ENCOUNTER — Other Ambulatory Visit: Payer: Self-pay

## 2019-09-03 ENCOUNTER — Inpatient Hospital Stay (HOSPITAL_COMMUNITY): Payer: BC Managed Care – PPO | Attending: Hematology | Admitting: Hematology

## 2019-09-03 ENCOUNTER — Other Ambulatory Visit (HOSPITAL_COMMUNITY): Payer: Self-pay | Admitting: *Deleted

## 2019-09-03 ENCOUNTER — Encounter (HOSPITAL_COMMUNITY): Payer: Self-pay | Admitting: Hematology

## 2019-09-03 ENCOUNTER — Inpatient Hospital Stay (HOSPITAL_COMMUNITY): Payer: BC Managed Care – PPO

## 2019-09-03 VITALS — BP 161/86 | HR 95 | Temp 97.5°F | Resp 20 | Wt >= 6400 oz

## 2019-09-03 DIAGNOSIS — Z79899 Other long term (current) drug therapy: Secondary | ICD-10-CM | POA: Diagnosis not present

## 2019-09-03 DIAGNOSIS — Z87891 Personal history of nicotine dependence: Secondary | ICD-10-CM | POA: Diagnosis not present

## 2019-09-03 DIAGNOSIS — Z833 Family history of diabetes mellitus: Secondary | ICD-10-CM | POA: Diagnosis not present

## 2019-09-03 DIAGNOSIS — D693 Immune thrombocytopenic purpura: Secondary | ICD-10-CM | POA: Diagnosis not present

## 2019-09-03 DIAGNOSIS — Z7952 Long term (current) use of systemic steroids: Secondary | ICD-10-CM | POA: Diagnosis not present

## 2019-09-03 DIAGNOSIS — D591 Autoimmune hemolytic anemia, unspecified: Secondary | ICD-10-CM

## 2019-09-03 DIAGNOSIS — Z8249 Family history of ischemic heart disease and other diseases of the circulatory system: Secondary | ICD-10-CM | POA: Diagnosis not present

## 2019-09-03 LAB — CBC WITH DIFFERENTIAL/PLATELET
Abs Immature Granulocytes: 0.11 10*3/uL — ABNORMAL HIGH (ref 0.00–0.07)
Basophils Absolute: 0 10*3/uL (ref 0.0–0.1)
Basophils Relative: 0 %
Eosinophils Absolute: 0 10*3/uL (ref 0.0–0.5)
Eosinophils Relative: 0 %
HCT: 45.7 % (ref 36.0–46.0)
Hemoglobin: 14.8 g/dL (ref 12.0–15.0)
Immature Granulocytes: 1 %
Lymphocytes Relative: 11 %
Lymphs Abs: 1.6 10*3/uL (ref 0.7–4.0)
MCH: 32.3 pg (ref 26.0–34.0)
MCHC: 32.4 g/dL (ref 30.0–36.0)
MCV: 99.8 fL (ref 80.0–100.0)
Monocytes Absolute: 0.2 10*3/uL (ref 0.1–1.0)
Monocytes Relative: 2 %
Neutro Abs: 13.7 10*3/uL — ABNORMAL HIGH (ref 1.7–7.7)
Neutrophils Relative %: 86 %
Platelets: 259 10*3/uL (ref 150–400)
RBC: 4.58 MIL/uL (ref 3.87–5.11)
RDW: 12.4 % (ref 11.5–15.5)
WBC: 15.7 10*3/uL — ABNORMAL HIGH (ref 4.0–10.5)
nRBC: 0 % (ref 0.0–0.2)

## 2019-09-03 LAB — RETICULOCYTES
Immature Retic Fract: 5.8 % (ref 2.3–15.9)
RBC.: 4.66 MIL/uL (ref 3.87–5.11)
Retic Count, Absolute: 77.4 10*3/uL (ref 19.0–186.0)
Retic Ct Pct: 1.7 % (ref 0.4–3.1)

## 2019-09-03 LAB — COMPREHENSIVE METABOLIC PANEL
ALT: 24 U/L (ref 0–44)
AST: 16 U/L (ref 15–41)
Albumin: 3.5 g/dL (ref 3.5–5.0)
Alkaline Phosphatase: 37 U/L — ABNORMAL LOW (ref 38–126)
Anion gap: 10 (ref 5–15)
BUN: 14 mg/dL (ref 6–20)
CO2: 27 mmol/L (ref 22–32)
Calcium: 8.8 mg/dL — ABNORMAL LOW (ref 8.9–10.3)
Chloride: 98 mmol/L (ref 98–111)
Creatinine, Ser: 0.74 mg/dL (ref 0.44–1.00)
GFR calc Af Amer: >60 mL/min (ref >60)
GFR calc non Af Amer: >60 mL/min (ref >60)
Glucose, Bld: 127 mg/dL — ABNORMAL HIGH (ref 70–99)
Potassium: 4 mmol/L (ref 3.5–5.1)
Sodium: 135 mmol/L (ref 135–145)
Total Bilirubin: 0.7 mg/dL (ref 0.3–1.2)
Total Protein: 7.1 g/dL (ref 6.5–8.1)

## 2019-09-03 MED ORDER — PREDNISONE 20 MG PO TABS: ORAL_TABLET | ORAL | 1 refills | Status: DC

## 2019-09-03 NOTE — Assessment & Plan Note (Signed)
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  Total bilirubin was also elevated consistent with hemolysis. -She had a Coombs test positive confirming it. -She denies any recent upper respiratory infections.  However she felt congested a month ago and had to take Mucinex. -Denies any B symptoms.  Physical exam does not show any palpable adenopathy or splenomegaly. -She was started on prednisone 160 mg on 07/21/2019.  We did not give her rituximab as she received it last year. -She is currently taking prednisone 100 mg daily.  She complains of swelling of her face and legs. -We reviewed her labs today.  Hemoglobin is 14.8.  Reticulocyte count is normal. -I have recommended cutting back on the dose of prednisone to 80 mg for a week and then 60 mg.  I will see her back in 2 weeks for follow-up.  2.  Immune thrombocytopenia: -Admitted to [facility] on 11/19/2017 with easy bruising, bleeding and platelet count of 0, status post IVIG 400 mg/kg/day for 5 days and prednisone 175 mg daily decrease 200 mg daily at discharge. -CT of the neck, chest, abdomen and pelvis on 11/20/2017 did not show any masses or adenopathy or splenomegaly. -Platelet count started following when prednisone was at 80 mg daily.  She received weekly rituximab x4 from 12/13/2017 through 01/03/2018. -Prednisone was tapered off in June 2019. -Most recent blood work showed her platelet count in the normal range.  3.  Health maintenance: -Last mammogram on 02/14/2018 was BI-RADS Category 1. -We will schedule her mammogram this year.

## 2019-09-03 NOTE — Progress Notes (Signed)
Tracey Maldonado, [place] [address]   CLINIC:  Medical Oncology/Hematology  PCP:  Emelda Fear, DO [address] [place] [address] [phone]   REASON FOR VISIT:  Follow-up for autoimmune hemolytic anemia.    INTERVAL HISTORY:  [name] 44 y.o. female seen for follow-up of autoimmune hemolytic anemia.  She is currently taking prednisone 100 mg daily.  Reports swelling of her face and legs.  Does not have any sleep issues.  Does not report any fevers or chills.   REVIEW OF SYSTEMS:  Review of Systems  All other systems reviewed and are negative.    PAST MEDICAL/SURGICAL HISTORY:  History reviewed. No pertinent past medical history. Past Surgical History:  Procedure Laterality Date  . APPENDECTOMY  1984     SOCIAL HISTORY:  Social History   Socioeconomic History  . Marital status: Widowed    Spouse name: Not on file  . Number of children: Not on file  . Years of education: Not on file  . Highest education level: Not on file  Occupational History  . Not on file  Tobacco Use  . Smoking status: Former Smoker    Packs/day: 0.25    Years: 5.00    Pack years: 1.25    Types: Cigarettes    Quit date: 11/26/2001    Years since quitting: 17.7  . Smokeless tobacco: Never Used  Substance and Sexual Activity  . Alcohol use: Yes    Comment: Occassionally  . Drug use: Never  . Sexual activity: Not on file  Other Topics Concern  . Not on file  Social History Narrative  . Not on file   Social Determinants of Health   Financial Resource Strain:   . Difficulty of Paying Living Expenses: Not on file  Food Insecurity:   . Worried About Charity fundraiser in the Last Year: Not on file  . Ran Out of Food in the Last Year: Not on file  Transportation Needs:   . Lack of Transportation (Medical): Not on file  . Lack of Transportation (Non-Medical): Not on file  Physical Activity:   . Days of Exercise per Week: Not on file  .  Minutes of Exercise per Session: Not on file  Stress:   . Feeling of Stress : Not on file  Social Connections:   . Frequency of Communication with Friends and Family: Not on file  . Frequency of Social Gatherings with Friends and Family: Not on file  . Attends Religious Services: Not on file  . Active Member of Clubs or Organizations: Not on file  . Attends Archivist Meetings: Not on file  . Marital Status: Not on file  Intimate Partner Violence:   . Fear of Current or Ex-Partner: Not on file  . Emotionally Abused: Not on file  . Physically Abused: Not on file  . Sexually Abused: Not on file    FAMILY HISTORY:  Family History  Problem Relation Age of Onset  . Hypertension Mother   . Diabetes Paternal Grandmother     CURRENT MEDICATIONS:  Outpatient Encounter Medications as of 09/03/2019  Medication Sig  . ESTARYLLA 0.25-35 MG-MCG tablet Take 1 tablet by mouth daily.   . folic acid (FOLVITE) 1 MG tablet Take 1 tablet (1 mg total) by mouth daily.  . predniSONE (DELTASONE) 20 MG tablet Take 5 tablets (100 mg total) by mouth daily with breakfast.  . [employer] PAIN RELIEF REGULAR ST 325 MG tablet Take  325 mg by mouth as needed.   . furosemide (LASIX) 20 MG tablet Take 1 tablet (20 mg total) by mouth daily as needed. (Patient not taking: Reported on 08/20/2019)   No facility-administered encounter medications on file as of 09/03/2019.    ALLERGIES:  No Known Allergies   PHYSICAL EXAM:  ECOG Performance status: 0  Vitals:   09/03/19 1623  BP: (!) 161/86  Pulse: 95  Resp: 20  Temp: (!) 97.5 F (36.4 C)  SpO2: 99%   Filed Weights   09/03/19 1623  Weight: (!) 412 lb (186.9 kg)    Physical Exam Vitals reviewed.  Constitutional:      Appearance: Normal appearance.  Cardiovascular:     Rate and Rhythm: Normal rate and regular rhythm.     Heart sounds: Normal heart sounds.  Pulmonary:     Effort: Pulmonary effort is normal.     Breath sounds: Normal breath  sounds.  Abdominal:     General: There is no distension.     Palpations: Abdomen is soft. There is no mass.  Musculoskeletal:        General: No swelling.  Skin:    General: Skin is warm.  Neurological:     General: No focal deficit present.     Mental Status: She is alert and oriented to person, place, and time.  Psychiatric:        Mood and Affect: Mood normal.        Behavior: Behavior normal.      LABORATORY DATA:  I have reviewed the labs as listed.  CBC    Component Value Date/Time   WBC 15.7 (H) 09/03/2019 1522   RBC 4.66 09/03/2019 1523   RBC 4.58 09/03/2019 1522   HGB 14.8 09/03/2019 1522   HCT 45.7 09/03/2019 1522   PLT 259 09/03/2019 1522   MCV 99.8 09/03/2019 1522   MCH 32.3 09/03/2019 1522   MCHC 32.4 09/03/2019 1522   RDW 12.4 09/03/2019 1522   LYMPHSABS 1.6 09/03/2019 1522   MONOABS 0.2 09/03/2019 1522   EOSABS 0.0 09/03/2019 1522   BASOSABS 0.0 09/03/2019 1522   CMP Latest Ref Rng & Units 09/03/2019 08/20/2019 08/06/2019  Glucose 70 - 99 mg/dL 127(H) 168(H) -  BUN 6 - 20 mg/dL 14 16 -  Creatinine 0.44 - 1.00 mg/dL 0.74 0.91 -  Sodium 135 - 145 mmol/L 135 137 -  Potassium 3.5 - 5.1 mmol/L 4.0 4.1 -  Chloride 98 - 111 mmol/L 98 99 -  CO2 22 - 32 mmol/L 27 27 -  Calcium 8.9 - 10.3 mg/dL 8.8(L) 8.9 -  Total Protein 6.5 - 8.1 g/dL 7.1 7.7 7.7  Total Bilirubin 0.3 - 1.2 mg/dL 0.7 0.7 1.1  Alkaline Phos 38 - 126 U/L 37(L) 43 73  AST 15 - 41 U/L 16 19 27   ALT 0 - 44 U/L 24 26 40       DIAGNOSTIC IMAGING:  I have independently reviewed the scans and discussed with the patient.   I have reviewed Venita Lick LPN's note and agree with the documentation.  I personally performed a face-to-face visit, made revisions and my assessment and plan is as follows.    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   Total bilirubin was also elevated consistent with hemolysis. -She had a Coombs test positive confirming it. -She denies any  recent upper respiratory infections.  However she felt congested a month ago and had to take Mucinex. -Denies any B symptoms.  Physical exam does not show any palpable adenopathy or splenomegaly. -She was started on prednisone 160 mg on 07/21/2019.  We did not give her rituximab as she received it last year. -She is currently taking prednisone 100 mg daily.  She complains of swelling of her face and legs. -We reviewed her labs today.  Hemoglobin is 14.8.  Reticulocyte count is normal. -I have recommended cutting back on the dose of prednisone to 80 mg for a week and then 60 mg.  I will see her back in 2 weeks for follow-up.  2.  Immune thrombocytopenia: -Admitted to [facility] on 11/19/2017 with easy bruising, bleeding and platelet count of 0, status post IVIG 400 mg/kg/day for 5 days and prednisone 175 mg daily decrease 200 mg daily at discharge. -CT of the neck, chest, abdomen and pelvis on 11/20/2017 did not show any masses or adenopathy or splenomegaly. -Platelet count started following when prednisone was at 80 mg daily.  She received weekly rituximab x4 from 12/13/2017 through 01/03/2018. -Prednisone was tapered off in June 2019. -Most recent blood work showed her platelet count in the normal range.  3.  Health maintenance: -Last mammogram on 02/14/2018 was BI-RADS Category 1. -We will schedule her mammogram this year.        Orders placed this encounter:  Orders Placed This Encounter  Procedures  . CBC with Differential/Platelet  . Comprehensive metabolic panel  . Lactate dehydrogenase  . Reticulocytes      Derek Jack, MD [facility] [phone]

## 2019-09-03 NOTE — Patient Instructions (Signed)
[place] at [facility] Discharge Instructions  You were seen today by Dr. Delton Coombes. He went over your recent lab results. Start taking 4 tablets of prednisone for 1 week, then take 3 tablets of prednisone for 1 week. He will see you back in 2 weeks for labs and follow up.   Thank you for choosing [place] at [facility] to provide your oncology and hematology care.  To afford each patient quality time with our provider, please arrive at least 15 minutes before your scheduled appointment time.   If you have a lab appointment with the [facility] please come in thru the  Main Entrance and check in at the main information desk  You need to re-schedule your appointment should you arrive 10 or more minutes late.  We strive to give you quality time with our providers, and arriving late affects you and other patients whose appointments are after yours.  Also, if you no show three or more times for appointments you may be dismissed from the clinic at the providers discretion.     Again, thank you for choosing [facility].  Our hope is that these requests will decrease the amount of time that you wait before being seen by our physicians.       _____________________________________________________________  Should you have questions after your visit to [facility], please contact our office at [phone] between the hours of 8:00 a.m. and 4:30 p.m.  Voicemails left after 4:00 p.m. will not be returned until the following business day.  For prescription refill requests, have your pharmacy contact our office and allow 72 hours.    Cancer Center Support Programs:   > Cancer Support Group  2nd Tuesday of the month 1pm-2pm, Journey Room

## 2019-09-17 ENCOUNTER — Encounter (HOSPITAL_COMMUNITY): Payer: Self-pay | Admitting: Nurse Practitioner

## 2019-09-17 ENCOUNTER — Inpatient Hospital Stay (HOSPITAL_COMMUNITY): Payer: BC Managed Care – PPO

## 2019-09-17 ENCOUNTER — Other Ambulatory Visit: Payer: Self-pay

## 2019-09-17 DIAGNOSIS — D591 Autoimmune hemolytic anemia, unspecified: Secondary | ICD-10-CM | POA: Diagnosis not present

## 2019-09-17 DIAGNOSIS — D693 Immune thrombocytopenic purpura: Secondary | ICD-10-CM

## 2019-09-17 LAB — CBC WITH DIFFERENTIAL/PLATELET
Abs Immature Granulocytes: 0.12 10*3/uL — ABNORMAL HIGH (ref 0.00–0.07)
Basophils Absolute: 0 10*3/uL (ref 0.0–0.1)
Basophils Relative: 0 %
Eosinophils Absolute: 0 10*3/uL (ref 0.0–0.5)
Eosinophils Relative: 0 %
HCT: 49.2 % — ABNORMAL HIGH (ref 36.0–46.0)
Hemoglobin: 15.9 g/dL — ABNORMAL HIGH (ref 12.0–15.0)
Immature Granulocytes: 1 %
Lymphocytes Relative: 10 %
Lymphs Abs: 2.3 10*3/uL (ref 0.7–4.0)
MCH: 31.5 pg (ref 26.0–34.0)
MCHC: 32.3 g/dL (ref 30.0–36.0)
MCV: 97.6 fL (ref 80.0–100.0)
Monocytes Absolute: 0.6 10*3/uL (ref 0.1–1.0)
Monocytes Relative: 3 %
Neutro Abs: 20 10*3/uL — ABNORMAL HIGH (ref 1.7–7.7)
Neutrophils Relative %: 86 %
Platelets: 296 10*3/uL (ref 150–400)
RBC: 5.04 MIL/uL (ref 3.87–5.11)
RDW: 12.2 % (ref 11.5–15.5)
WBC: 23.1 10*3/uL — ABNORMAL HIGH (ref 4.0–10.5)
nRBC: 0 % (ref 0.0–0.2)

## 2019-09-17 LAB — COMPREHENSIVE METABOLIC PANEL
ALT: 23 U/L (ref 0–44)
AST: 19 U/L (ref 15–41)
Albumin: 3.8 g/dL (ref 3.5–5.0)
Alkaline Phosphatase: 39 U/L (ref 38–126)
Anion gap: 14 (ref 5–15)
BUN: 14 mg/dL (ref 6–20)
CO2: 25 mmol/L (ref 22–32)
Calcium: 9.2 mg/dL (ref 8.9–10.3)
Chloride: 97 mmol/L — ABNORMAL LOW (ref 98–111)
Creatinine, Ser: 0.92 mg/dL (ref 0.44–1.00)
GFR calc Af Amer: >60 mL/min (ref >60)
GFR calc non Af Amer: >60 mL/min (ref >60)
Glucose, Bld: 115 mg/dL — ABNORMAL HIGH (ref 70–99)
Potassium: 4 mmol/L (ref 3.5–5.1)
Sodium: 136 mmol/L (ref 135–145)
Total Bilirubin: 0.6 mg/dL (ref 0.3–1.2)
Total Protein: 8 g/dL (ref 6.5–8.1)

## 2019-09-17 LAB — RETICULOCYTES
Immature Retic Fract: 5.1 % (ref 2.3–15.9)
RBC.: 5.05 MIL/uL (ref 3.87–5.11)
Retic Count, Absolute: 74.2 10*3/uL (ref 19.0–186.0)
Retic Ct Pct: 1.5 % (ref 0.4–3.1)

## 2019-09-17 LAB — LACTATE DEHYDROGENASE: LDH: 293 U/L — ABNORMAL HIGH (ref 98–192)

## 2019-09-18 ENCOUNTER — Inpatient Hospital Stay (HOSPITAL_BASED_OUTPATIENT_CLINIC_OR_DEPARTMENT_OTHER): Payer: BC Managed Care – PPO | Admitting: Hematology

## 2019-09-18 VITALS — BP 159/73 | HR 111 | Temp 97.1°F | Resp 18 | Wt >= 6400 oz

## 2019-09-18 DIAGNOSIS — D693 Immune thrombocytopenic purpura: Secondary | ICD-10-CM | POA: Diagnosis not present

## 2019-09-18 DIAGNOSIS — D591 Autoimmune hemolytic anemia, unspecified: Secondary | ICD-10-CM | POA: Diagnosis not present

## 2019-09-18 NOTE — Progress Notes (Signed)
[place], [place] [address]   CLINIC:  Medical Oncology/Hematology  PCP:  Emelda Fear, DO [address] [place] [address] [phone]   REASON FOR VISIT:  Follow-up for autoimmune hemolytic anemia.    INTERVAL HISTORY:  Ms. Trojanowski 44 y.o. female seen for follow-up of autoimmune hemolytic anemia.  She is currently taking prednisone 60 mg daily.  She reported weight gain of 20 pounds since the start of prednisone.  Denies any gastric distress.  Reports some shortness of breath on exertion.  REVIEW OF SYSTEMS:  Review of Systems  All other systems reviewed and are negative.    PAST MEDICAL/SURGICAL HISTORY:  No past medical history on file. Past Surgical History:  Procedure Laterality Date  . APPENDECTOMY  1984     SOCIAL HISTORY:  Social History   Socioeconomic History  . Marital status: Widowed    Spouse name: Not on file  . Number of children: Not on file  . Years of education: Not on file  . Highest education level: Not on file  Occupational History  . Not on file  Tobacco Use  . Smoking status: Former Smoker    Packs/day: 0.25    Years: 5.00    Pack years: 1.25    Types: Cigarettes    Quit date: 11/26/2001    Years since quitting: 17.8  . Smokeless tobacco: Never Used  Substance and Sexual Activity  . Alcohol use: Yes    Comment: Occassionally  . Drug use: Never  . Sexual activity: Not on file  Other Topics Concern  . Not on file  Social History Narrative  . Not on file   Social Determinants of Health   Financial Resource Strain:   . Difficulty of Paying Living Expenses: Not on file  Food Insecurity:   . Worried About Charity fundraiser in the Last Year: Not on file  . Ran Out of Food in the Last Year: Not on file  Transportation Needs:   . Lack of Transportation (Medical): Not on file  . Lack of Transportation (Non-Medical): Not on file  Physical Activity:   . Days of Exercise per Week: Not on  file  . Minutes of Exercise per Session: Not on file  Stress:   . Feeling of Stress : Not on file  Social Connections:   . Frequency of Communication with Friends and Family: Not on file  . Frequency of Social Gatherings with Friends and Family: Not on file  . Attends Religious Services: Not on file  . Active Member of Clubs or Organizations: Not on file  . Attends Archivist Meetings: Not on file  . Marital Status: Not on file  Intimate Partner Violence:   . Fear of Current or Ex-Partner: Not on file  . Emotionally Abused: Not on file  . Physically Abused: Not on file  . Sexually Abused: Not on file    FAMILY HISTORY:  Family History  Problem Relation Age of Onset  . Hypertension Mother   . Diabetes Paternal Grandmother     CURRENT MEDICATIONS:  Outpatient Encounter Medications as of 09/18/2019  Medication Sig  . ESTARYLLA 0.25-35 MG-MCG tablet Take 1 tablet by mouth daily.   . folic acid (FOLVITE) 1 MG tablet Take 1 tablet (1 mg total) by mouth daily.  . predniSONE (DELTASONE) 20 MG tablet Take as directed  . [employer] PAIN RELIEF REGULAR ST 325 MG tablet Take 325 mg by mouth as needed.   Marland Kitchen  furosemide (LASIX) 20 MG tablet Take 1 tablet (20 mg total) by mouth daily as needed. (Patient not taking: Reported on 08/20/2019)   No facility-administered encounter medications on file as of 09/18/2019.    ALLERGIES:  No Known Allergies   PHYSICAL EXAM:  ECOG Performance status: 0  Vitals:   09/18/19 1151  BP: (!) 159/73  Pulse: (!) 111  Resp: 18  Temp: (!) 97.1 F (36.2 C)  SpO2: 98%   Filed Weights   09/18/19 1151  Weight: (!) 419 lb 3.2 oz (190.1 kg)    Physical Exam Vitals reviewed.  Constitutional:      Appearance: Normal appearance.  Cardiovascular:     Rate and Rhythm: Normal rate and regular rhythm.     Heart sounds: Normal heart sounds.  Pulmonary:     Effort: Pulmonary effort is normal.     Breath sounds: Normal breath sounds.  Abdominal:      General: There is no distension.     Palpations: Abdomen is soft. There is no mass.  Musculoskeletal:        General: No swelling.  Skin:    General: Skin is warm.  Neurological:     General: No focal deficit present.     Mental Status: She is alert and oriented to person, place, and time.  Psychiatric:        Mood and Affect: Mood normal.        Behavior: Behavior normal.      LABORATORY DATA:  I have reviewed the labs as listed.  CBC    Component Value Date/Time   WBC 23.1 (H) 09/17/2019 1211   RBC 5.04 09/17/2019 1211   RBC 5.05 09/17/2019 1211   HGB 15.9 (H) 09/17/2019 1211   HCT 49.2 (H) 09/17/2019 1211   PLT 296 09/17/2019 1211   MCV 97.6 09/17/2019 1211   MCH 31.5 09/17/2019 1211   MCHC 32.3 09/17/2019 1211   RDW 12.2 09/17/2019 1211   LYMPHSABS 2.3 09/17/2019 1211   MONOABS 0.6 09/17/2019 1211   EOSABS 0.0 09/17/2019 1211   BASOSABS 0.0 09/17/2019 1211   CMP Latest Ref Rng & Units 09/17/2019 09/03/2019 08/20/2019  Glucose 70 - 99 mg/dL 115(H) 127(H) 168(H)  BUN 6 - 20 mg/dL 14 14 16   Creatinine 0.44 - 1.00 mg/dL 0.92 0.74 0.91  Sodium 135 - 145 mmol/L 136 135 137  Potassium 3.5 - 5.1 mmol/L 4.0 4.0 4.1  Chloride 98 - 111 mmol/L 97(L) 98 99  CO2 22 - 32 mmol/L 25 27 27   Calcium 8.9 - 10.3 mg/dL 9.2 8.8(L) 8.9  Total Protein 6.5 - 8.1 g/dL 8.0 7.1 7.7  Total Bilirubin 0.3 - 1.2 mg/dL 0.6 0.7 0.7  Alkaline Phos 38 - 126 U/L 39 37(L) 43  AST 15 - 41 U/L 19 16 19   ALT 0 - 44 U/L 23 24 26        DIAGNOSTIC IMAGING:  I have independently reviewed the scans and discussed with the patient.   I have reviewed Venita Lick LPN's note and agree with the documentation.  I personally performed a face-to-face visit, made revisions and my assessment and plan is as follows.    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   Total bilirubin was also elevated consistent with hemolysis. -She had a Coombs test positive confirming it. -She denies any recent upper respiratory infections.  However she felt congested a month ago and had to take Mucinex. -Denies any B symptoms.  Physical exam does not show any palpable adenopathy or splenomegaly. -She was started on prednisone 160 mg on 07/21/2019.  We did not give her rituximab as she received it last year. -She is taking prednisone 60 mg daily.  I reviewed her labs.  Hemoglobin is stable.  LDH is elevated at 293.  Reticulocyte count is normal. -We will cut back on prednisone to 40 mg daily today.  In 2 weeks she will cut it further down to 20 mg daily. -I will reevaluate her in 4 weeks with repeat labs.  2.  Immune thrombocytopenia: -Admitted to [facility] on 11/19/2017 with easy bruising, bleeding and platelet count of 0, status post IVIG 400 mg/kg/day for 5 days and prednisone 175 mg daily decrease 200 mg daily at discharge. -CT of the neck, chest, abdomen and pelvis on 11/20/2017 did not show any masses or adenopathy or splenomegaly. -Platelet count started following when prednisone was at 80 mg daily.  She received weekly rituximab x4 from 12/13/2017 through 01/03/2018. -Prednisone was tapered off in June 2019. -Most recent blood work showed her platelet count in the normal range.  3.  Health maintenance: -Last mammogram on 02/14/2018 was BI-RADS Category 1. -We will schedule her mammogram this year.        Orders placed this encounter:  Orders Placed This Encounter  Procedures  . CBC with Differential/Platelet  . Comprehensive metabolic panel  . Lactate dehydrogenase  . Reticulocytes      Derek Jack, MD [place] [phone]

## 2019-09-18 NOTE — Patient Instructions (Signed)
[place] at [facility] Discharge Instructions  You were seen today by Dr. Delton Coombes. He went over your recent results. Starting tomorrow cut Prednisone back to 2 tablets a day for 2 weeks then star taking 1 tablet a day until you see [place] again. He will see you back in 4 weeks for labs and follow up.   Thank you for choosing [place] at [facility] to provide your oncology and hematology care.  To afford each patient quality time with our provider, please arrive at least 15 minutes before your scheduled appointment time.   If you have a lab appointment with the [facility] please come in thru the  Main Entrance and check in at the main information desk  You need to re-schedule your appointment should you arrive 10 or more minutes late.  We strive to give you quality time with our providers, and arriving late affects you and other patients whose appointments are after yours.  Also, if you no show three or more times for appointments you may be dismissed from the clinic at the providers discretion.     Again, thank you for choosing [facility].  Our hope is that these requests will decrease the amount of time that you wait before being seen by our physicians.       _____________________________________________________________  Should you have questions after your visit to [facility], please contact our office at [phone] between the hours of 8:00 a.m. and 4:30 p.m.  Voicemails left after 4:00 p.m. will not be returned until the following business day.  For prescription refill requests, have your pharmacy contact our office and allow 72 hours.    Cancer Center Support Programs:   > Cancer Support Group  2nd Tuesday of the month 1pm-2pm, Journey Room

## 2019-09-21 NOTE — Assessment & Plan Note (Signed)
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  Total bilirubin was also elevated consistent with hemolysis. -She had a Coombs test positive confirming it. -She denies any recent upper respiratory infections.  However she felt congested a month ago and had to take Mucinex. -Denies any B symptoms.  Physical exam does not show any palpable adenopathy or splenomegaly. -She was started on prednisone 160 mg on 07/21/2019.  We did not give her rituximab as she received it last year. -She is taking prednisone 60 mg daily.  I reviewed her labs.  Hemoglobin is stable.  LDH is elevated at 293.  Reticulocyte count is normal. -We will cut back on prednisone to 40 mg daily today.  In 2 weeks she will cut it further down to 20 mg daily. -I will reevaluate her in 4 weeks with repeat labs.  2.  Immune thrombocytopenia: -Admitted to [facility] on 11/19/2017 with easy bruising, bleeding and platelet count of 0, status post IVIG 400 mg/kg/day for 5 days and prednisone 175 mg daily decrease 200 mg daily at discharge. -CT of the neck, chest, abdomen and pelvis on 11/20/2017 did not show any masses or adenopathy or splenomegaly. -Platelet count started following when prednisone was at 80 mg daily.  She received weekly rituximab x4 from 12/13/2017 through 01/03/2018. -Prednisone was tapered off in June 2019. -Most recent blood work showed her platelet count in the normal range.  3.  Health maintenance: -Last mammogram on 02/14/2018 was BI-RADS Category 1. -We will schedule her mammogram this year.

## 2019-10-13 ENCOUNTER — Other Ambulatory Visit (HOSPITAL_COMMUNITY): Payer: Self-pay | Admitting: Nurse Practitioner

## 2019-10-13 DIAGNOSIS — D693 Immune thrombocytopenic purpura: Secondary | ICD-10-CM

## 2019-10-15 ENCOUNTER — Inpatient Hospital Stay (HOSPITAL_COMMUNITY): Payer: BC Managed Care – PPO | Attending: Hematology

## 2019-10-15 ENCOUNTER — Other Ambulatory Visit: Payer: Self-pay

## 2019-10-15 DIAGNOSIS — D591 Autoimmune hemolytic anemia, unspecified: Secondary | ICD-10-CM | POA: Diagnosis not present

## 2019-10-15 DIAGNOSIS — D693 Immune thrombocytopenic purpura: Secondary | ICD-10-CM | POA: Diagnosis not present

## 2019-10-15 LAB — RETICULOCYTES
Immature Retic Fract: 11.5 % (ref 2.3–15.9)
RBC.: 5.2 MIL/uL — ABNORMAL HIGH (ref 3.87–5.11)
Retic Count, Absolute: 106.1 10*3/uL (ref 19.0–186.0)
Retic Ct Pct: 2 % (ref 0.4–3.1)

## 2019-10-15 LAB — COMPREHENSIVE METABOLIC PANEL
ALT: 18 U/L (ref 0–44)
AST: 20 U/L (ref 15–41)
Albumin: 3.5 g/dL (ref 3.5–5.0)
Alkaline Phosphatase: 47 U/L (ref 38–126)
Anion gap: 11 (ref 5–15)
BUN: 12 mg/dL (ref 6–20)
CO2: 24 mmol/L (ref 22–32)
Calcium: 8.9 mg/dL (ref 8.9–10.3)
Chloride: 100 mmol/L (ref 98–111)
Creatinine, Ser: 0.72 mg/dL (ref 0.44–1.00)
GFR calc Af Amer: >60 mL/min (ref >60)
GFR calc non Af Amer: >60 mL/min (ref >60)
Glucose, Bld: 129 mg/dL — ABNORMAL HIGH (ref 70–99)
Potassium: 3.8 mmol/L (ref 3.5–5.1)
Sodium: 135 mmol/L (ref 135–145)
Total Bilirubin: 0.7 mg/dL (ref 0.3–1.2)
Total Protein: 7.9 g/dL (ref 6.5–8.1)

## 2019-10-15 LAB — CBC WITH DIFFERENTIAL/PLATELET
Abs Immature Granulocytes: 0.14 10*3/uL — ABNORMAL HIGH (ref 0.00–0.07)
Basophils Absolute: 0.1 10*3/uL (ref 0.0–0.1)
Basophils Relative: 0 %
Eosinophils Absolute: 0 10*3/uL (ref 0.0–0.5)
Eosinophils Relative: 0 %
HCT: 47.8 % — ABNORMAL HIGH (ref 36.0–46.0)
Hemoglobin: 15.6 g/dL — ABNORMAL HIGH (ref 12.0–15.0)
Immature Granulocytes: 1 %
Lymphocytes Relative: 14 %
Lymphs Abs: 2.9 10*3/uL (ref 0.7–4.0)
MCH: 30 pg (ref 26.0–34.0)
MCHC: 32.6 g/dL (ref 30.0–36.0)
MCV: 91.9 fL (ref 80.0–100.0)
Monocytes Absolute: 0.7 10*3/uL (ref 0.1–1.0)
Monocytes Relative: 3 %
Neutro Abs: 16.8 10*3/uL — ABNORMAL HIGH (ref 1.7–7.7)
Neutrophils Relative %: 82 %
Platelets: 342 10*3/uL (ref 150–400)
RBC: 5.2 MIL/uL — ABNORMAL HIGH (ref 3.87–5.11)
RDW: 12.2 % (ref 11.5–15.5)
WBC: 20.6 10*3/uL — ABNORMAL HIGH (ref 4.0–10.5)
nRBC: 0 % (ref 0.0–0.2)

## 2019-10-15 LAB — LACTATE DEHYDROGENASE: LDH: 214 U/L — ABNORMAL HIGH (ref 98–192)

## 2019-10-16 ENCOUNTER — Encounter (HOSPITAL_COMMUNITY): Payer: Self-pay | Admitting: Hematology

## 2019-10-16 ENCOUNTER — Inpatient Hospital Stay (HOSPITAL_BASED_OUTPATIENT_CLINIC_OR_DEPARTMENT_OTHER): Payer: BC Managed Care – PPO | Admitting: Hematology

## 2019-10-16 DIAGNOSIS — D591 Autoimmune hemolytic anemia, unspecified: Secondary | ICD-10-CM

## 2019-10-16 NOTE — Progress Notes (Signed)
Virtual Visit via Telephone Note  I connected with Tracey Maldonado on 10/16/19 at 11:45 AM EST by telephone and verified that I am speaking with the correct person using two identifiers.   I discussed the limitations, risks, security and privacy concerns of performing an evaluation and management service by telephone and the availability of in person appointments. I also discussed with the patient that there may be a patient responsible charge related to this service. The patient expressed understanding and agreed to proceed.   History of Present Illness: She is seen in the clinic for autoimmune hemolytic anemia which was diagnosed on 07/21/2019 with a positive Coombs test.  She was started on prednisone 160 mg on the same day and has been on gradual taper since then.  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  She was also treated with rituximab from 12/13/2017 through 01/03/2018.   Observations/Objective: She reports that she has been doing well.  Currently she is taking prednisone 20 mg daily which was tapered down 2 weeks ago.  Appetite and energy levels are 100%.  Complains of ankle swelling since the start of prednisone.  Denies any fevers or infections.  Assessment and Plan:  1.  Autoimmune hemolytic anemia: -We reviewed CBC from 10/15/2019.  White count is 20.6 and hemoglobin is 15.6.  LDH is 214.  Total bilirubin is 0.7.  Reticulocyte count is 2%. -She is taking prednisone 20 mg daily for the last 2 weeks.  I have told her to cut back to 10 mg for the next 2 weeks.  After that she will cut it down to 5 mg for the next 4 weeks. -I will reevaluate her in 6 weeks with labs.  We will also check direct Coombs test at that time.  2.  Immune thrombocytopenia: -Initially treated with IVIG and prednisone on 11/19/2017. -Received rituximab weekly x4 from 12/13/2017 through 01/03/2018. -Current platelet count is 342.   Follow Up Instructions: RTC 6 weeks with labs.   I  discussed the assessment and treatment plan with the patient. The patient was provided an opportunity to ask questions and all were answered. The patient agreed with the plan and demonstrated an understanding of the instructions.   The patient was advised to call back or seek an in-person evaluation if the symptoms worsen or if the condition fails to improve as anticipated.  I provided 8 minutes of non-face-to-face time during this encounter.   Derek Jack, MD

## 2019-10-16 NOTE — Patient Instructions (Signed)
[facility] at [facility]  Discharge Instructions:  You had a phone visit with Dr. Katragadda today. _______________________________________________________________  Thank you for choosing [facility] at [facility] to provide your oncology and hematology care.  To afford each patient quality time with our providers, please arrive at least 15 minutes before your scheduled appointment.  You need to re-schedule your appointment if you arrive 10 or more minutes late.  We strive to give you quality time with our providers, and arriving late affects you and other patients whose appointments are after yours.  Also, if you no show three or more times for appointments you may be dismissed from the clinic.  Again, thank you for choosing [facility] at [facility]. Our hope is that these requests will allow you access to exceptional care and in a timely manner. _______________________________________________________________  If you have questions after your visit, please contact our office at [phone] between the hours of 8:30 a.m. and 5:00 p.m. Voicemails left after 4:30 p.m. will not be returned until the following business day. _______________________________________________________________  For prescription refill requests, have your pharmacy contact our office. _______________________________________________________________  Recommendations made by the consultant and any test results will be sent to your referring physician. _______________________________________________________________ 

## 2019-11-25 ENCOUNTER — Other Ambulatory Visit: Payer: Self-pay

## 2019-11-25 ENCOUNTER — Inpatient Hospital Stay (HOSPITAL_COMMUNITY): Payer: BC Managed Care – PPO | Attending: Hematology

## 2019-11-25 DIAGNOSIS — Z9221 Personal history of antineoplastic chemotherapy: Secondary | ICD-10-CM | POA: Insufficient documentation

## 2019-11-25 DIAGNOSIS — Z79899 Other long term (current) drug therapy: Secondary | ICD-10-CM | POA: Insufficient documentation

## 2019-11-25 DIAGNOSIS — D591 Autoimmune hemolytic anemia, unspecified: Secondary | ICD-10-CM | POA: Insufficient documentation

## 2019-11-25 DIAGNOSIS — D693 Immune thrombocytopenic purpura: Secondary | ICD-10-CM | POA: Insufficient documentation

## 2019-11-25 DIAGNOSIS — M7989 Other specified soft tissue disorders: Secondary | ICD-10-CM | POA: Diagnosis not present

## 2019-11-25 DIAGNOSIS — Z7952 Long term (current) use of systemic steroids: Secondary | ICD-10-CM | POA: Insufficient documentation

## 2019-11-25 LAB — CBC WITH DIFFERENTIAL/PLATELET
Abs Immature Granulocytes: 0.04 10*3/uL (ref 0.00–0.07)
Basophils Absolute: 0.1 10*3/uL (ref 0.0–0.1)
Basophils Relative: 1 %
Eosinophils Absolute: 0.5 10*3/uL (ref 0.0–0.5)
Eosinophils Relative: 3 %
HCT: 43.4 % (ref 36.0–46.0)
Hemoglobin: 14.2 g/dL (ref 12.0–15.0)
Immature Granulocytes: 0 %
Lymphocytes Relative: 26 %
Lymphs Abs: 3.7 10*3/uL (ref 0.7–4.0)
MCH: 28.6 pg (ref 26.0–34.0)
MCHC: 32.7 g/dL (ref 30.0–36.0)
MCV: 87.5 fL (ref 80.0–100.0)
Monocytes Absolute: 0.9 10*3/uL (ref 0.1–1.0)
Monocytes Relative: 6 %
Neutro Abs: 9.4 10*3/uL — ABNORMAL HIGH (ref 1.7–7.7)
Neutrophils Relative %: 64 %
Platelets: 294 10*3/uL (ref 150–400)
RBC: 4.96 MIL/uL (ref 3.87–5.11)
RDW: 13.2 % (ref 11.5–15.5)
WBC: 14.6 10*3/uL — ABNORMAL HIGH (ref 4.0–10.5)
nRBC: 0 % (ref 0.0–0.2)

## 2019-11-25 LAB — COMPREHENSIVE METABOLIC PANEL
ALT: 17 U/L (ref 0–44)
AST: 19 U/L (ref 15–41)
Albumin: 3.2 g/dL — ABNORMAL LOW (ref 3.5–5.0)
Alkaline Phosphatase: 39 U/L (ref 38–126)
Anion gap: 10 (ref 5–15)
BUN: 12 mg/dL (ref 6–20)
CO2: 22 mmol/L (ref 22–32)
Calcium: 8.6 mg/dL — ABNORMAL LOW (ref 8.9–10.3)
Chloride: 103 mmol/L (ref 98–111)
Creatinine, Ser: 0.71 mg/dL (ref 0.44–1.00)
GFR calc Af Amer: >60 mL/min (ref >60)
GFR calc non Af Amer: >60 mL/min (ref >60)
Glucose, Bld: 100 mg/dL — ABNORMAL HIGH (ref 70–99)
Potassium: 3.6 mmol/L (ref 3.5–5.1)
Sodium: 135 mmol/L (ref 135–145)
Total Bilirubin: 0.9 mg/dL (ref 0.3–1.2)
Total Protein: 7.5 g/dL (ref 6.5–8.1)

## 2019-11-25 LAB — RETICULOCYTES
Immature Retic Fract: 12.3 % (ref 2.3–15.9)
RBC.: 4.95 MIL/uL (ref 3.87–5.11)
Retic Count, Absolute: 101 10*3/uL (ref 19.0–186.0)
Retic Ct Pct: 2 % (ref 0.4–3.1)

## 2019-11-25 LAB — LACTATE DEHYDROGENASE: LDH: 166 U/L (ref 98–192)

## 2019-11-27 ENCOUNTER — Other Ambulatory Visit: Payer: Self-pay

## 2019-11-27 ENCOUNTER — Inpatient Hospital Stay (HOSPITAL_BASED_OUTPATIENT_CLINIC_OR_DEPARTMENT_OTHER): Payer: BC Managed Care – PPO | Admitting: Hematology

## 2019-11-27 DIAGNOSIS — D591 Autoimmune hemolytic anemia, unspecified: Secondary | ICD-10-CM

## 2019-11-27 NOTE — Progress Notes (Signed)
Virtual Visit via Telephone Note  I connected with Tracey Maldonado on 11/27/19 at  4:05 PM EDT by telephone and verified that I am speaking with the correct person using two identifiers.   I discussed the limitations, risks, security and privacy concerns of performing an evaluation and management service by telephone and the availability of in person appointments. I also discussed with the patient that there may be a patient responsible charge related to this service. The patient expressed understanding and agreed to proceed.   History of Present Illness: She is seen in our clinic for autoimmune hemolytic anemia.  She also has a history of immune thrombocytopenia.  She has been started on prednisone on 07/21/2019 at 160 mg daily and has been tapered since then.    Observations/Objective: She is currently taking prednisone 5 mg daily.  She reports weight gain.  Also reports leg swelling.  She has fluid pills and uses as needed.  She did get her Covid shot second dose recently.  Denies any fevers or infections.  Appetite and energy levels are 100%.  No new onset pains reported.  Assessment and Plan:  1.  Autoimmune hemolytic anemia: -She is currently taking prednisone 5 mg daily for the last 4 weeks. -We reviewed labs from 11/25/2019.  Hemoglobin is 14.2.  Platelet count is 294.  White count is 14.6.  LDH is 166.  Total bilirubin is 0.9.  Reticulocyte count is normal at 2%. -Unfortunately Coombs test was not done. -I will further taper prednisone to 5 mg every other day over the next 2 weeks.  She will completely stop prednisone after 2 weeks. -I plan to repeat her blood work in 4 weeks.  I plan to repeat Coombs test at that time.  2.  Immune thrombocytopenia: -Initially treated with IVIG and prednisone on 11/19/2017. -Received rituximab weekly x4 from 12/13/2017 through 01/03/2018. -Current platelet count is 294.   Follow Up Instructions: RTC 4 weeks.   I discussed the assessment and treatment  plan with the patient. The patient was provided an opportunity to ask questions and all were answered. The patient agreed with the plan and demonstrated an understanding of the instructions.   The patient was advised to call back or seek an in-person evaluation if the symptoms worsen or if the condition fails to improve as anticipated.  I provided 8 minutes of non-face-to-face time during this encounter.   Derek Jack, MD

## 2019-12-22 ENCOUNTER — Inpatient Hospital Stay (HOSPITAL_COMMUNITY): Payer: BC Managed Care – PPO | Attending: Hematology

## 2019-12-22 ENCOUNTER — Other Ambulatory Visit: Payer: Self-pay

## 2019-12-22 DIAGNOSIS — D696 Thrombocytopenia, unspecified: Secondary | ICD-10-CM | POA: Diagnosis not present

## 2019-12-22 DIAGNOSIS — M7989 Other specified soft tissue disorders: Secondary | ICD-10-CM | POA: Diagnosis not present

## 2019-12-22 DIAGNOSIS — Z79899 Other long term (current) drug therapy: Secondary | ICD-10-CM | POA: Insufficient documentation

## 2019-12-22 DIAGNOSIS — R220 Localized swelling, mass and lump, head: Secondary | ICD-10-CM | POA: Insufficient documentation

## 2019-12-22 DIAGNOSIS — D591 Autoimmune hemolytic anemia, unspecified: Secondary | ICD-10-CM | POA: Diagnosis present

## 2019-12-22 LAB — CBC WITH DIFFERENTIAL/PLATELET
Abs Immature Granulocytes: 0.03 10*3/uL (ref 0.00–0.07)
Basophils Absolute: 0.1 10*3/uL (ref 0.0–0.1)
Basophils Relative: 0 %
Eosinophils Absolute: 0.3 10*3/uL (ref 0.0–0.5)
Eosinophils Relative: 2 %
HCT: 43.7 % (ref 36.0–46.0)
Hemoglobin: 14.2 g/dL (ref 12.0–15.0)
Immature Granulocytes: 0 %
Lymphocytes Relative: 35 %
Lymphs Abs: 4.3 10*3/uL — ABNORMAL HIGH (ref 0.7–4.0)
MCH: 28.7 pg (ref 26.0–34.0)
MCHC: 32.5 g/dL (ref 30.0–36.0)
MCV: 88.5 fL (ref 80.0–100.0)
Monocytes Absolute: 0.8 10*3/uL (ref 0.1–1.0)
Monocytes Relative: 7 %
Neutro Abs: 6.7 10*3/uL (ref 1.7–7.7)
Neutrophils Relative %: 56 %
Platelets: 309 10*3/uL (ref 150–400)
RBC: 4.94 MIL/uL (ref 3.87–5.11)
RDW: 13.5 % (ref 11.5–15.5)
WBC: 12.3 10*3/uL — ABNORMAL HIGH (ref 4.0–10.5)
nRBC: 0 % (ref 0.0–0.2)

## 2019-12-22 LAB — LACTATE DEHYDROGENASE: LDH: 171 U/L (ref 98–192)

## 2019-12-22 LAB — RETICULOCYTES
Immature Retic Fract: 10.8 % (ref 2.3–15.9)
RBC.: 4.98 MIL/uL (ref 3.87–5.11)
Retic Count, Absolute: 132 10*3/uL (ref 19.0–186.0)
Retic Ct Pct: 2.7 % (ref 0.4–3.1)

## 2019-12-29 ENCOUNTER — Other Ambulatory Visit: Payer: Self-pay

## 2019-12-29 ENCOUNTER — Inpatient Hospital Stay (HOSPITAL_BASED_OUTPATIENT_CLINIC_OR_DEPARTMENT_OTHER): Payer: BC Managed Care – PPO | Admitting: Hematology

## 2019-12-29 DIAGNOSIS — D591 Autoimmune hemolytic anemia, unspecified: Secondary | ICD-10-CM | POA: Diagnosis not present

## 2019-12-29 NOTE — Progress Notes (Signed)
Virtual Visit via Telephone Note  I connected with Tracey Maldonado on 12/29/19 at  3:45 PM EDT by telephone and verified that I am speaking with the correct person using two identifiers.   I discussed the limitations, risks, security and privacy concerns of performing an evaluation and management service by telephone and the availability of in person appointments. I also discussed with the patient that there may be a patient responsible charge related to this service. The patient expressed understanding and agreed to proceed.   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  Tracey Maldonado was started on prednisone on 07/21/2019.  It was tapered off around 12/11/2019.   Observations/Objective: Prednisone was tapered off about 3 weeks ago.  Tracey Maldonado denies any excessive tiredness.  Tracey Maldonado is working full-time.  Face swelling has improved slightly.  Still has some leg swelling.  Assessment and Plan:  1.  Autoimmune hemolytic anemia: -Prednisone tapered off around 12/11/2019. -We reviewed labs from 12/22/2019.  Hemoglobin is 14.2.  Platelet count is 309. -White count is slightly elevated at 12.3 probably from prednisone.  Reticulocyte count is 2.7.  Coombs test was not done.  LDH was normal at 171. -I will see her back in 3 months for follow-up.  Tracey Maldonado was told to come back sooner should Tracey Maldonado develop any easy bruising or excessive tiredness. -Tracey Maldonado was told to continue folic acid daily.  2.  Immune mediated thrombocytopenia: -Treated with IVIG and prednisone on 11/19/2017. -Rituximab weekly x4 from 12/13/2017 through 01/03/2018.  Current platelet count is normal at 309.   Follow Up Instructions: RTC 3 months with labs.   I discussed the assessment and treatment plan with the patient. The patient was provided an opportunity to ask questions and all were answered. The patient agreed with the plan and demonstrated an understanding of the  instructions.   The patient was advised to call back or seek an in-person evaluation if the symptoms worsen or if the condition fails to improve as anticipated.     I provided 11 minutes of non-face-to-face time during this encounter.   Derek Jack, MD

## 2020-01-05 ENCOUNTER — Encounter (HOSPITAL_COMMUNITY): Payer: Self-pay

## 2020-01-05 NOTE — Progress Notes (Unsigned)
Per Dr. Delton Coombes, your August follow up may be changed to a virtual visit.   Changes to the appointment will reflect in [place].

## 2020-01-05 NOTE — Progress Notes (Unsigned)
Per Dr. Delton Coombes, your August follow up may be changed to a virtual visit.   Changes to the appointment will reflect in [place].

## 2020-01-14 ENCOUNTER — Encounter (HOSPITAL_COMMUNITY): Payer: Self-pay

## 2020-03-24 ENCOUNTER — Inpatient Hospital Stay (HOSPITAL_COMMUNITY): Payer: BC Managed Care – PPO | Attending: Hematology

## 2020-03-24 ENCOUNTER — Other Ambulatory Visit: Payer: Self-pay

## 2020-03-24 DIAGNOSIS — D591 Autoimmune hemolytic anemia, unspecified: Secondary | ICD-10-CM | POA: Diagnosis present

## 2020-03-24 LAB — RETIC PANEL
Immature Retic Fract: 12 % (ref 2.3–15.9)
RBC.: 4.8 MIL/uL (ref 3.87–5.11)
Retic Count, Absolute: 96 10*3/uL (ref 19.0–186.0)
Retic Ct Pct: 2 % (ref 0.4–3.1)
Reticulocyte Hemoglobin: 34.1 pg (ref >27.9)

## 2020-03-24 LAB — CBC WITH DIFFERENTIAL/PLATELET
Abs Immature Granulocytes: 0.03 10*3/uL (ref 0.00–0.07)
Basophils Absolute: 0.1 10*3/uL (ref 0.0–0.1)
Basophils Relative: 1 %
Eosinophils Absolute: 0.9 10*3/uL — ABNORMAL HIGH (ref 0.0–0.5)
Eosinophils Relative: 8 %
HCT: 41.8 % (ref 36.0–46.0)
Hemoglobin: 13.8 g/dL (ref 12.0–15.0)
Immature Granulocytes: 0 %
Lymphocytes Relative: 26 %
Lymphs Abs: 2.7 10*3/uL (ref 0.7–4.0)
MCH: 28.5 pg (ref 26.0–34.0)
MCHC: 33 g/dL (ref 30.0–36.0)
MCV: 86.4 fL (ref 80.0–100.0)
Monocytes Absolute: 0.8 10*3/uL (ref 0.1–1.0)
Monocytes Relative: 8 %
Neutro Abs: 6.1 10*3/uL (ref 1.7–7.7)
Neutrophils Relative %: 57 %
Platelets: 313 10*3/uL (ref 150–400)
RBC: 4.84 MIL/uL (ref 3.87–5.11)
RDW: 13.2 % (ref 11.5–15.5)
WBC: 10.5 10*3/uL (ref 4.0–10.5)
nRBC: 0 % (ref 0.0–0.2)

## 2020-03-24 LAB — LACTATE DEHYDROGENASE: LDH: 166 U/L (ref 98–192)

## 2020-03-31 ENCOUNTER — Inpatient Hospital Stay (HOSPITAL_COMMUNITY): Payer: BC Managed Care – PPO | Admitting: Hematology

## 2020-03-31 ENCOUNTER — Other Ambulatory Visit: Payer: Self-pay

## 2020-04-18 ENCOUNTER — Other Ambulatory Visit (HOSPITAL_COMMUNITY): Payer: Self-pay | Admitting: Nurse Practitioner

## 2020-04-18 DIAGNOSIS — D693 Immune thrombocytopenic purpura: Secondary | ICD-10-CM

## 2020-05-24 ENCOUNTER — Encounter (HOSPITAL_COMMUNITY): Payer: Self-pay

## 2020-06-09 ENCOUNTER — Other Ambulatory Visit (HOSPITAL_COMMUNITY): Payer: Self-pay | Admitting: Hematology

## 2020-06-09 DIAGNOSIS — Z1231 Encounter for screening mammogram for malignant neoplasm of breast: Secondary | ICD-10-CM

## 2020-06-30 ENCOUNTER — Inpatient Hospital Stay (HOSPITAL_COMMUNITY): Payer: BC Managed Care – PPO | Attending: Hematology

## 2020-06-30 ENCOUNTER — Other Ambulatory Visit: Payer: Self-pay

## 2020-06-30 DIAGNOSIS — D591 Autoimmune hemolytic anemia, unspecified: Secondary | ICD-10-CM | POA: Diagnosis present

## 2020-06-30 DIAGNOSIS — Z79899 Other long term (current) drug therapy: Secondary | ICD-10-CM | POA: Diagnosis not present

## 2020-06-30 DIAGNOSIS — D696 Thrombocytopenia, unspecified: Secondary | ICD-10-CM | POA: Diagnosis not present

## 2020-06-30 DIAGNOSIS — Z9221 Personal history of antineoplastic chemotherapy: Secondary | ICD-10-CM | POA: Insufficient documentation

## 2020-06-30 DIAGNOSIS — D693 Immune thrombocytopenic purpura: Secondary | ICD-10-CM

## 2020-06-30 LAB — CBC WITH DIFFERENTIAL/PLATELET
Abs Immature Granulocytes: 0.03 10*3/uL (ref 0.00–0.07)
Basophils Absolute: 0.1 10*3/uL (ref 0.0–0.1)
Basophils Relative: 1 %
Eosinophils Absolute: 0.7 10*3/uL — ABNORMAL HIGH (ref 0.0–0.5)
Eosinophils Relative: 7 %
HCT: 39.6 % (ref 36.0–46.0)
Hemoglobin: 12.9 g/dL (ref 12.0–15.0)
Immature Granulocytes: 0 %
Lymphocytes Relative: 29 %
Lymphs Abs: 3.1 10*3/uL (ref 0.7–4.0)
MCH: 32.2 pg (ref 26.0–34.0)
MCHC: 32.6 g/dL (ref 30.0–36.0)
MCV: 98.8 fL (ref 80.0–100.0)
Monocytes Absolute: 0.8 10*3/uL (ref 0.1–1.0)
Monocytes Relative: 8 %
Neutro Abs: 6 10*3/uL (ref 1.7–7.7)
Neutrophils Relative %: 55 %
Platelets: 324 10*3/uL (ref 150–400)
RBC: 4.01 MIL/uL (ref 3.87–5.11)
RDW: 12.2 % (ref 11.5–15.5)
WBC: 10.8 10*3/uL — ABNORMAL HIGH (ref 4.0–10.5)
nRBC: 0 % (ref 0.0–0.2)

## 2020-06-30 LAB — COMPREHENSIVE METABOLIC PANEL
ALT: 25 U/L (ref 0–44)
AST: 24 U/L (ref 15–41)
Albumin: 3.4 g/dL — ABNORMAL LOW (ref 3.5–5.0)
Alkaline Phosphatase: 59 U/L (ref 38–126)
Anion gap: 9 (ref 5–15)
BUN: 9 mg/dL (ref 6–20)
CO2: 25 mmol/L (ref 22–32)
Calcium: 9.3 mg/dL (ref 8.9–10.3)
Chloride: 101 mmol/L (ref 98–111)
Creatinine, Ser: 0.68 mg/dL (ref 0.44–1.00)
GFR, Estimated: >60 mL/min (ref >60)
Glucose, Bld: 94 mg/dL (ref 70–99)
Potassium: 3.6 mmol/L (ref 3.5–5.1)
Sodium: 135 mmol/L (ref 135–145)
Total Bilirubin: 0.9 mg/dL (ref 0.3–1.2)
Total Protein: 8.1 g/dL (ref 6.5–8.1)

## 2020-06-30 LAB — LACTATE DEHYDROGENASE: LDH: 146 U/L (ref 98–192)

## 2020-07-07 ENCOUNTER — Other Ambulatory Visit: Payer: Self-pay

## 2020-07-07 ENCOUNTER — Inpatient Hospital Stay (HOSPITAL_COMMUNITY): Payer: BC Managed Care – PPO | Admitting: Hematology

## 2020-07-07 ENCOUNTER — Encounter (HOSPITAL_COMMUNITY): Payer: Self-pay

## 2020-07-08 ENCOUNTER — Inpatient Hospital Stay (HOSPITAL_BASED_OUTPATIENT_CLINIC_OR_DEPARTMENT_OTHER): Payer: BC Managed Care – PPO | Admitting: Hematology

## 2020-07-08 DIAGNOSIS — D591 Autoimmune hemolytic anemia, unspecified: Secondary | ICD-10-CM

## 2020-07-08 NOTE — Progress Notes (Signed)
Virtual Visit via Telephone Note  I connected with Tracey Maldonado on 07/08/20 at  4:30 PM EST by telephone and verified that I am speaking with the correct person using two identifiers.  Location: Patient: At home Provider: In the office   I discussed the limitations, risks, security and privacy concerns of performing an evaluation and management service by telephone and the availability of in person appointments. I also discussed with the patient that there may be a patient responsible charge related to this service. The patient expressed understanding and agreed to proceed.   History of Present Illness: She is seen in our clinic for autoimmune hemolytic anemia and immune mediated thrombocytopenia.  For her autoimmune hemolytic anemia, she was started on prednisone around 07/21/2019 and was tapered off around 12/11/2019.   Observations/Objective: She reports that she has been doing fairly well.  She is still working at her bank job.  Appetite is 90%.  Energy levels are 80%.  No new pains reported.  Denies any recent infections.  Last received moderna vaccine in March and April of this year.  Assessment and Plan:  1.  Autoimmune hemolytic anemia: -Prednisone tapered off around 12/11/2019. -Labs from 06/30/2020 shows normal LDH.  Hemoglobin is 12.9.  CMP is within normal limits with total bilirubin of 0.9. -Plan to see her back in 6 months for follow-up. -We will repeat direct Coombs test, LDH and reticulocyte's at next visit. -I have recommended her to take booster shot of Mederna.  2.  Immune mediated thrombocytopenia: -Treated with IVIG and prednisone on 11/19/2017. -Rituximab weekly x4 from 12/13/2017 through 01/03/2018. -Current platelet count is 324 and stable.   Follow Up Instructions: RTC 6 months with labs.   I discussed the assessment and treatment plan with the patient. The patient was provided an opportunity to ask questions and all were answered. The patient agreed with the  plan and demonstrated an understanding of the instructions.   The patient was advised to call back or seek an in-person evaluation if the symptoms worsen or if the condition fails to improve as anticipated.  I provided 12 minutes of non-face-to-face time during this encounter.   Derek Jack, MD

## 2020-07-09 ENCOUNTER — Encounter (HOSPITAL_COMMUNITY): Payer: Self-pay

## 2020-07-09 ENCOUNTER — Other Ambulatory Visit (HOSPITAL_COMMUNITY): Payer: Self-pay | Admitting: *Deleted

## 2020-08-05 ENCOUNTER — Ambulatory Visit (HOSPITAL_COMMUNITY)
Admission: RE | Admit: 2020-08-05 | Discharge: 2020-08-05 | Disposition: A | Discharge status: Home / Self Care | Payer: BC Managed Care – PPO | Source: Ambulatory Visit | Attending: Hematology | Admitting: Hematology

## 2020-08-05 ENCOUNTER — Other Ambulatory Visit: Payer: Self-pay

## 2020-08-05 DIAGNOSIS — Z1231 Encounter for screening mammogram for malignant neoplasm of breast: Secondary | ICD-10-CM | POA: Insufficient documentation

## 2021-01-04 ENCOUNTER — Other Ambulatory Visit (HOSPITAL_COMMUNITY): Payer: Self-pay | Admitting: *Deleted

## 2021-01-04 DIAGNOSIS — D591 Autoimmune hemolytic anemia, unspecified: Secondary | ICD-10-CM

## 2021-01-04 DIAGNOSIS — D693 Immune thrombocytopenic purpura: Secondary | ICD-10-CM

## 2021-01-05 ENCOUNTER — Other Ambulatory Visit: Payer: Self-pay

## 2021-01-05 ENCOUNTER — Inpatient Hospital Stay (HOSPITAL_COMMUNITY): Payer: BC Managed Care – PPO | Attending: Hematology

## 2021-01-05 DIAGNOSIS — Z79899 Other long term (current) drug therapy: Secondary | ICD-10-CM | POA: Insufficient documentation

## 2021-01-05 DIAGNOSIS — D693 Immune thrombocytopenic purpura: Secondary | ICD-10-CM | POA: Insufficient documentation

## 2021-01-05 DIAGNOSIS — D591 Autoimmune hemolytic anemia, unspecified: Secondary | ICD-10-CM | POA: Diagnosis not present

## 2021-01-05 LAB — CBC WITH DIFFERENTIAL/PLATELET
Abs Immature Granulocytes: 0.02 10*3/uL (ref 0.00–0.07)
Basophils Absolute: 0 10*3/uL (ref 0.0–0.1)
Basophils Relative: 1 %
Eosinophils Absolute: 0.4 10*3/uL (ref 0.0–0.5)
Eosinophils Relative: 5 %
HCT: 41.7 % (ref 36.0–46.0)
Hemoglobin: 14 g/dL (ref 12.0–15.0)
Immature Granulocytes: 0 %
Lymphocytes Relative: 23 %
Lymphs Abs: 1.9 10*3/uL (ref 0.7–4.0)
MCH: 29.4 pg (ref 26.0–34.0)
MCHC: 33.6 g/dL (ref 30.0–36.0)
MCV: 87.4 fL (ref 80.0–100.0)
Monocytes Absolute: 0.5 10*3/uL (ref 0.1–1.0)
Monocytes Relative: 6 %
Neutro Abs: 5.7 10*3/uL (ref 1.7–7.7)
Neutrophils Relative %: 65 %
Platelets: 244 10*3/uL (ref 150–400)
RBC: 4.77 MIL/uL (ref 3.87–5.11)
RDW: 12.6 % (ref 11.5–15.5)
WBC: 8.5 10*3/uL (ref 4.0–10.5)
nRBC: 0 % (ref 0.0–0.2)

## 2021-01-05 LAB — RETICULOCYTES
Immature Retic Fract: 6.6 % (ref 2.3–15.9)
RBC.: 4.68 MIL/uL (ref 3.87–5.11)
Retic Count, Absolute: 73.5 10*3/uL (ref 19.0–186.0)
Retic Ct Pct: 1.6 % (ref 0.4–3.1)

## 2021-01-05 LAB — COMPREHENSIVE METABOLIC PANEL
ALT: 21 U/L (ref 0–44)
AST: 27 U/L (ref 15–41)
Albumin: 3.2 g/dL — ABNORMAL LOW (ref 3.5–5.0)
Alkaline Phosphatase: 49 U/L (ref 38–126)
Anion gap: 9 (ref 5–15)
BUN: 8 mg/dL (ref 6–20)
CO2: 23 mmol/L (ref 22–32)
Calcium: 8.6 mg/dL — ABNORMAL LOW (ref 8.9–10.3)
Chloride: 102 mmol/L (ref 98–111)
Creatinine, Ser: 0.74 mg/dL (ref 0.44–1.00)
GFR, Estimated: >60 mL/min (ref >60)
Glucose, Bld: 107 mg/dL — ABNORMAL HIGH (ref 70–99)
Potassium: 3.4 mmol/L — ABNORMAL LOW (ref 3.5–5.1)
Sodium: 134 mmol/L — ABNORMAL LOW (ref 135–145)
Total Bilirubin: 0.8 mg/dL (ref 0.3–1.2)
Total Protein: 8.1 g/dL (ref 6.5–8.1)

## 2021-01-05 LAB — LACTATE DEHYDROGENASE: LDH: 148 U/L (ref 98–192)

## 2021-01-07 ENCOUNTER — Encounter (HOSPITAL_COMMUNITY): Payer: Self-pay | Admitting: Hematology

## 2021-01-07 ENCOUNTER — Other Ambulatory Visit: Payer: Self-pay

## 2021-01-07 ENCOUNTER — Inpatient Hospital Stay (HOSPITAL_BASED_OUTPATIENT_CLINIC_OR_DEPARTMENT_OTHER): Payer: BC Managed Care – PPO | Admitting: Hematology

## 2021-01-07 DIAGNOSIS — D693 Immune thrombocytopenic purpura: Secondary | ICD-10-CM

## 2021-01-07 DIAGNOSIS — D591 Autoimmune hemolytic anemia, unspecified: Secondary | ICD-10-CM | POA: Diagnosis not present

## 2021-01-07 NOTE — Progress Notes (Signed)
Virtual Visit via Telephone Note  I connected with Tracey Maldonado on 01/07/21 at 12:00 PM EDT by telephone and verified that I am speaking with the correct person using two identifiers.  Location: Patient: At home Provider: In the office   I discussed the limitations, risks, security and privacy concerns of performing an evaluation and management service by telephone and the availability of in person appointments. I also discussed with the patient that there may be a patient responsible charge related to this service. The patient expressed understanding and agreed to proceed.   History of Present Illness: She is seen in our clinic for autoimmune hemolytic anemia and ITP.  She received prednisone for autoimmune hemolytic anemia from 07/21/2019 which was tapered off around 12/11/2019.   Observations/Objective: She reports that she is recovering from a recent sinus infection.  She still has some clear mucus.  Denies any fevers.  She reports that her current weight is 396, down from 419 previously in our clinic.  She is actively trying to lose weight by exercise.  No easy bruising or bleeding reported.  Assessment and Plan:  1.  Autoimmune hemolytic anemia: - Prednisone tapered off around 12/11/2019. - Reviewed labs from 01/05/2021 which shows hemoglobin 14 and hematocrit of 41.7.  Reticulocyte count is 1.6%.  LDH was normal at 148.  Total bilirubin was 0.8. - She does not have any signs of hemolysis at this time. - RTC 6 months for follow-up.  2.  Immune mediated thrombocytopenia: - Treated with IVIG and prednisone on 11/19/2017. - Rituximab weekly x4 from 12/13/2017 through 01/03/2018. - Platelet count is 244 on 01/05/2021. - RTC 6 months with repeat labs.   Follow Up Instructions:   RTC 6 months with labs 2 days prior. I discussed the assessment and treatment plan with the patient. The patient was provided an opportunity to ask questions and all were answered. The patient agreed with the  plan and demonstrated an understanding of the instructions.   The patient was advised to call back or seek an in-person evaluation if the symptoms worsen or if the condition fails to improve as anticipated.  I provided 11 minutes of non-face-to-face time during this encounter.   Derek Jack, MD

## 2021-01-12 ENCOUNTER — Telehealth (HOSPITAL_COMMUNITY): Payer: BC Managed Care – PPO | Admitting: Hematology

## 2021-01-17 IMAGING — MG DIGITAL SCREENING BILAT W/ TOMO W/ CAD
6 of 10 series · 6 of 30 positions shown · non-contrast
Comparison: Previous exam(s).

CLINICAL DATA: Screening.

EXAM:
DIGITAL SCREENING BILATERAL MAMMOGRAM WITH TOMO AND CAD

[L MLO synth-2D]
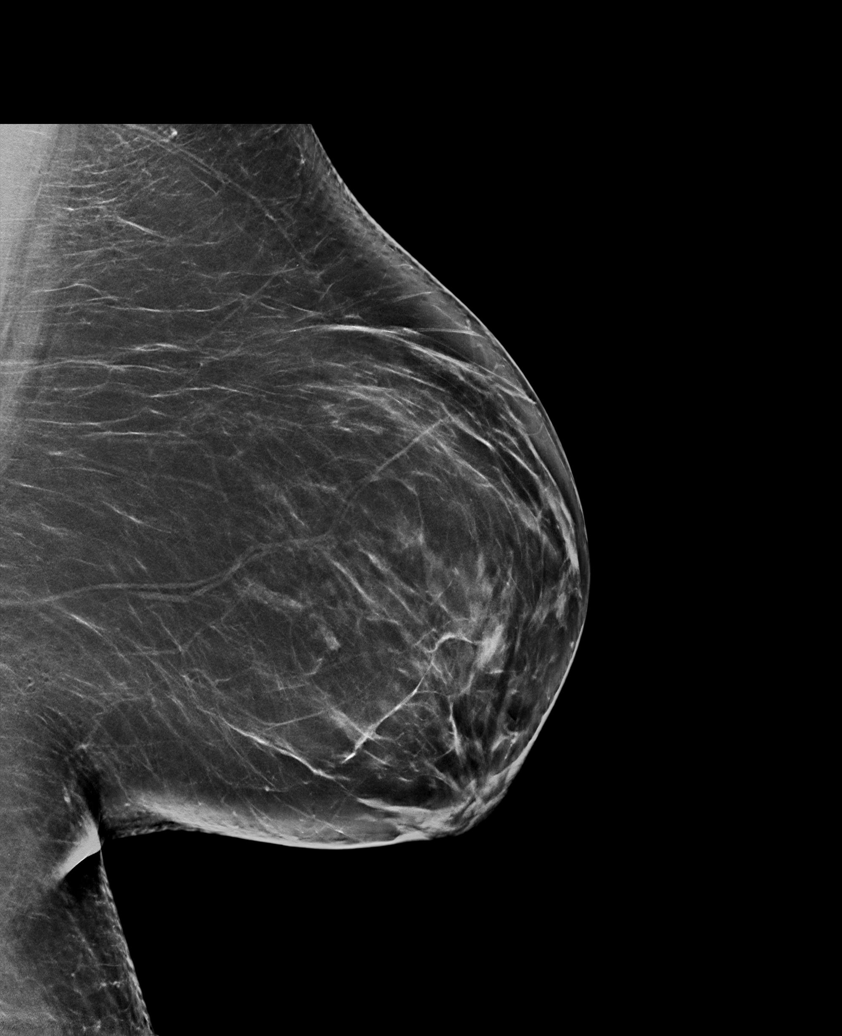

[R XCCL synth-2D]
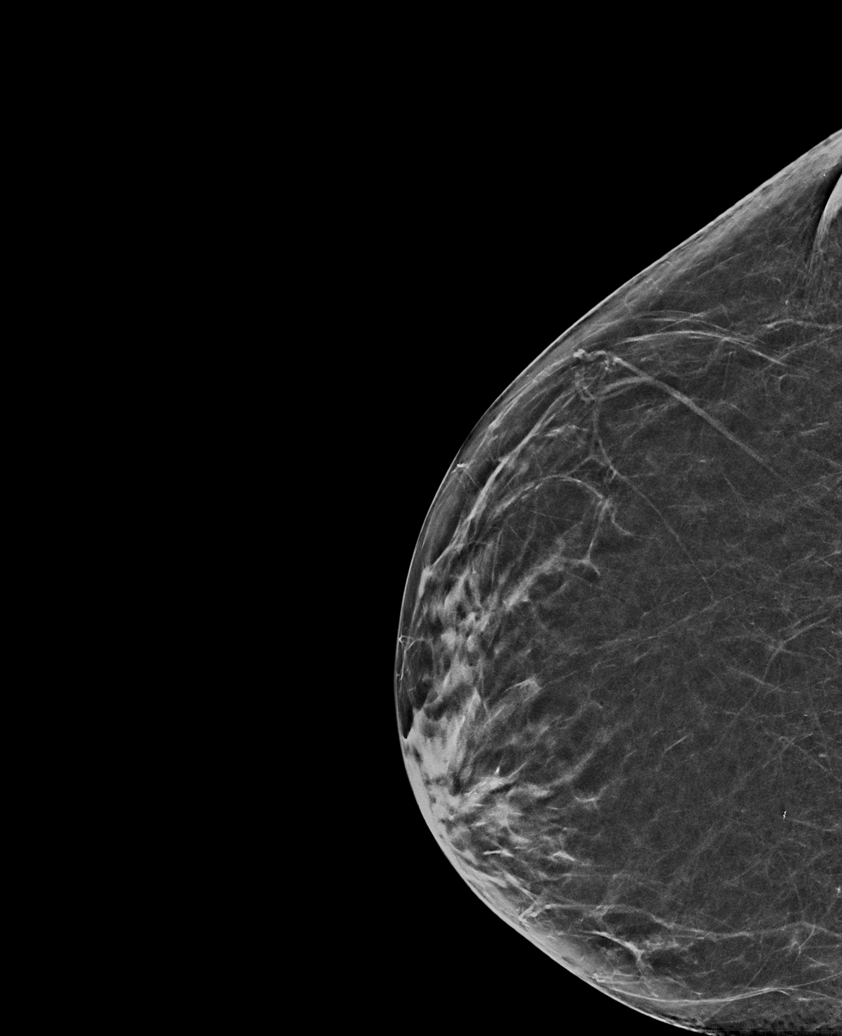

[L CC synth-2D]
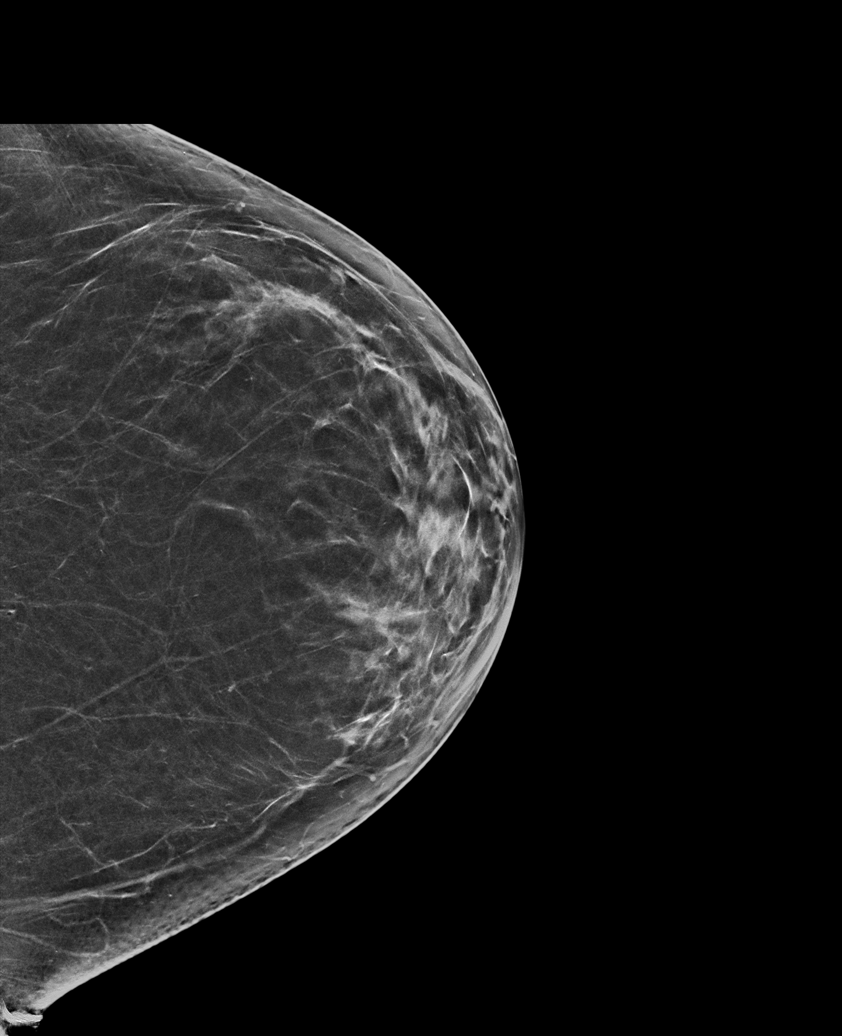

[R CC synth-2D]
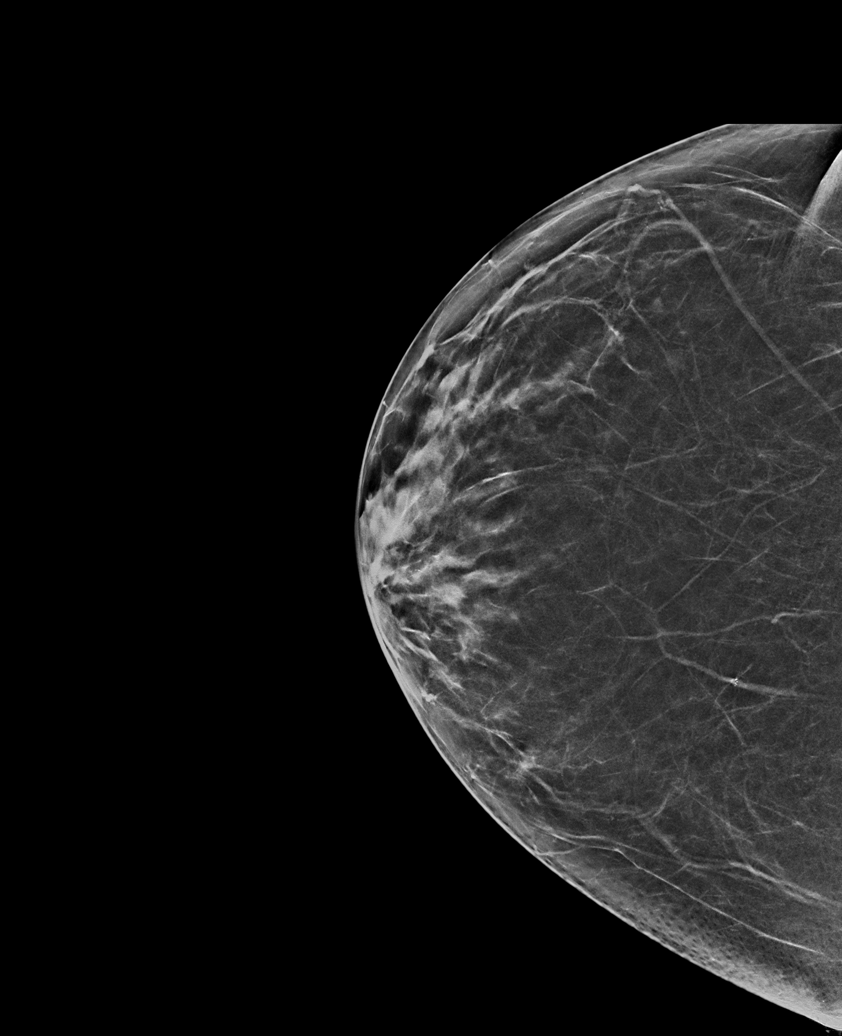

[R MLO synth-2D]
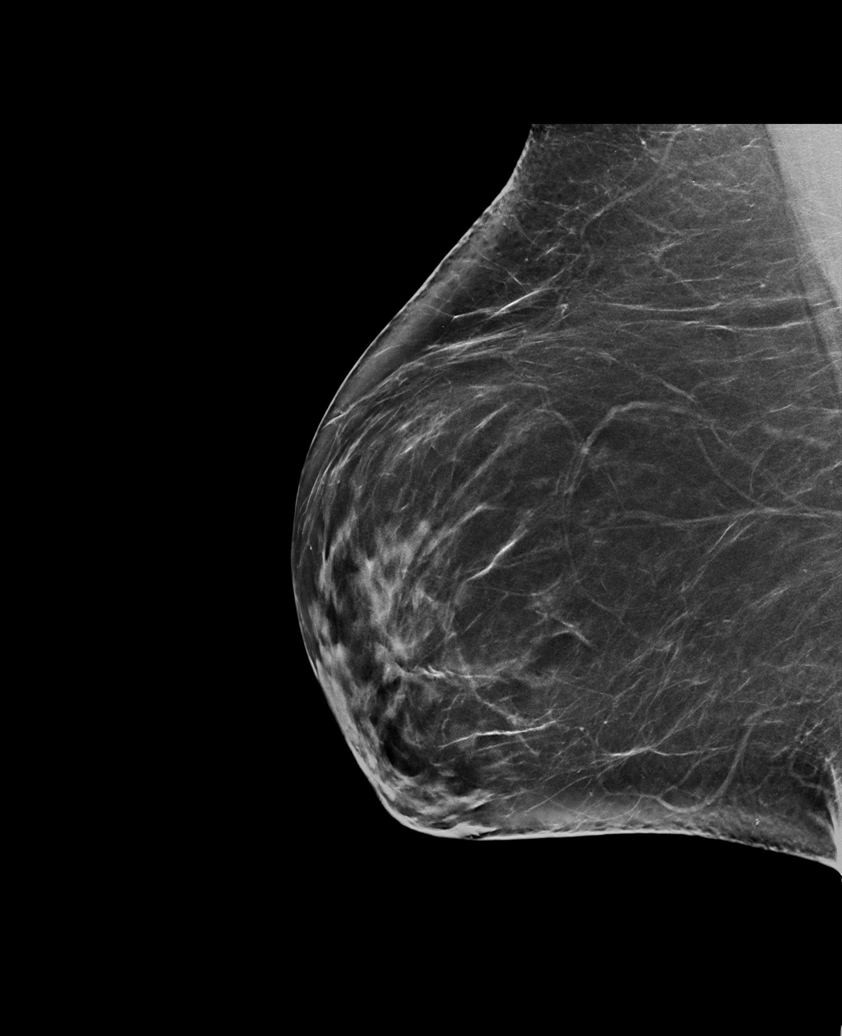

[R MLO tomo · tomo slice 40/79.0]
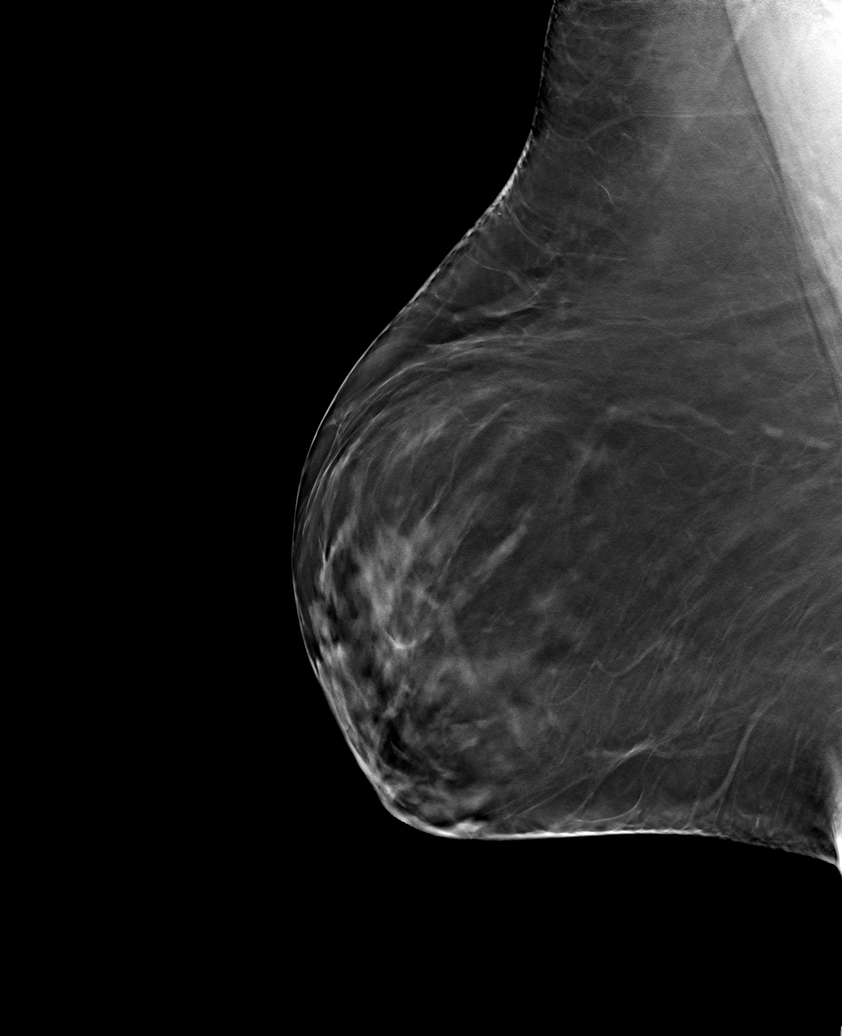

[6 of 30 positions shown; findings below may reference images not displayed]

ACR Breast Density Category b: There are scattered areas of
fibroglandular density.
FINDINGS: There are no findings suspicious for malignancy. Images were
processed with CAD.
IMPRESSION: No mammographic evidence of malignancy. A result letter of this
screening mammogram will be mailed directly to the patient.

RECOMMENDATION:
Screening mammogram in one year. (Code:[id number])

BI-RADS CATEGORY  1: Negative.

## 2021-06-22 ENCOUNTER — Other Ambulatory Visit (HOSPITAL_COMMUNITY): Payer: Self-pay | Admitting: Hematology

## 2021-06-22 DIAGNOSIS — Z1231 Encounter for screening mammogram for malignant neoplasm of breast: Secondary | ICD-10-CM

## 2021-07-04 ENCOUNTER — Other Ambulatory Visit: Payer: Self-pay

## 2021-07-04 ENCOUNTER — Inpatient Hospital Stay (HOSPITAL_COMMUNITY): Payer: BC Managed Care – PPO | Attending: Hematology

## 2021-07-04 DIAGNOSIS — Z862 Personal history of diseases of the blood and blood-forming organs and certain disorders involving the immune mechanism: Secondary | ICD-10-CM | POA: Insufficient documentation

## 2021-07-04 DIAGNOSIS — D6959 Other secondary thrombocytopenia: Secondary | ICD-10-CM | POA: Diagnosis not present

## 2021-07-04 DIAGNOSIS — D591 Autoimmune hemolytic anemia, unspecified: Secondary | ICD-10-CM | POA: Insufficient documentation

## 2021-07-04 DIAGNOSIS — D693 Immune thrombocytopenic purpura: Secondary | ICD-10-CM

## 2021-07-04 LAB — CBC WITH DIFFERENTIAL/PLATELET
Abs Immature Granulocytes: 0.02 10*3/uL (ref 0.00–0.07)
Basophils Absolute: 0.1 10*3/uL (ref 0.0–0.1)
Basophils Relative: 1 %
Eosinophils Absolute: 0.7 10*3/uL — ABNORMAL HIGH (ref 0.0–0.5)
Eosinophils Relative: 7 %
HCT: 40.2 % (ref 36.0–46.0)
Hemoglobin: 13.9 g/dL (ref 12.0–15.0)
Immature Granulocytes: 0 %
Lymphocytes Relative: 26 %
Lymphs Abs: 2.8 10*3/uL (ref 0.7–4.0)
MCH: 30.4 pg (ref 26.0–34.0)
MCHC: 34.6 g/dL (ref 30.0–36.0)
MCV: 88 fL (ref 80.0–100.0)
Monocytes Absolute: 0.5 10*3/uL (ref 0.1–1.0)
Monocytes Relative: 5 %
Neutro Abs: 6.6 10*3/uL (ref 1.7–7.7)
Neutrophils Relative %: 61 %
Platelets: 278 10*3/uL (ref 150–400)
RBC: 4.57 MIL/uL (ref 3.87–5.11)
RDW: 13.5 % (ref 11.5–15.5)
WBC: 10.8 10*3/uL — ABNORMAL HIGH (ref 4.0–10.5)
nRBC: 0 % (ref 0.0–0.2)

## 2021-07-04 LAB — COMPREHENSIVE METABOLIC PANEL
ALT: 28 U/L (ref 0–44)
AST: 31 U/L (ref 15–41)
Albumin: 3.4 g/dL — ABNORMAL LOW (ref 3.5–5.0)
Alkaline Phosphatase: 57 U/L (ref 38–126)
Anion gap: 7 (ref 5–15)
BUN: 12 mg/dL (ref 6–20)
CO2: 25 mmol/L (ref 22–32)
Calcium: 8.9 mg/dL (ref 8.9–10.3)
Chloride: 102 mmol/L (ref 98–111)
Creatinine, Ser: 0.74 mg/dL (ref 0.44–1.00)
GFR, Estimated: >60 mL/min (ref >60)
Glucose, Bld: 118 mg/dL — ABNORMAL HIGH (ref 70–99)
Potassium: 3.3 mmol/L — ABNORMAL LOW (ref 3.5–5.1)
Sodium: 134 mmol/L — ABNORMAL LOW (ref 135–145)
Total Bilirubin: 0.4 mg/dL (ref 0.3–1.2)
Total Protein: 8.7 g/dL — ABNORMAL HIGH (ref 6.5–8.1)

## 2021-07-04 LAB — RETICULOCYTES
Immature Retic Fract: 7.7 % (ref 2.3–15.9)
RBC.: 4.77 MIL/uL (ref 3.87–5.11)
Retic Count, Absolute: 80.1 10*3/uL (ref 19.0–186.0)
Retic Ct Pct: 1.7 % (ref 0.4–3.1)

## 2021-07-04 LAB — LACTATE DEHYDROGENASE: LDH: 143 U/L (ref 98–192)

## 2021-07-11 ENCOUNTER — Ambulatory Visit (HOSPITAL_COMMUNITY): Payer: BC Managed Care – PPO | Admitting: Hematology

## 2021-07-25 NOTE — Progress Notes (Signed)
   Virtual Visit via Telephone Note [facility]  I connected with Tracey Maldonado  on 07/26/21  at  1:30 PM  by telephone and verified that I am speaking with the correct person using two identifiers.  Location: Patient: Home Provider: [facility]   I discussed the limitations, risks, security and privacy concerns of performing an evaluation and management service by telephone and the availability of in person appointments. I also discussed with the patient that there may be a patient responsible charge related to this service. The patient expressed understanding and agreed to proceed.   HISTORY OF PRESENT ILLNESS: [name] is seen in our clinic for autoimmune hemolytic anemia and ITP.  She has previously received prednisone for autoimmune hemolytic anemia from 07/21/2019, which was tapered off around 12/11/2019.  She was last evaluated via telemedicine visit by Dr. Delton Coombes on 01/07/2021.  No fatigue, fever, chills, shortness of breath, cough, chest pain, nausea, vomiting, abdominal pain.  Denies any signs or symptoms of blood loss. No frequent infections.  No abnormal bruising or petechial rash.  No current signs or symptoms of blood clots.   She reports 100% energy and 100% appetite.    OBSERVATIONS/OBJECTIVE: Review of Systems  Constitutional:  Negative for chills, diaphoresis, fever, malaise/fatigue and weight loss.  Respiratory:  Negative for cough and shortness of breath.   Cardiovascular:  Negative for chest pain and palpitations.  Gastrointestinal:  Negative for abdominal pain, blood in stool, melena, nausea and vomiting.  Neurological:  Negative for dizziness and headaches.    PHYSICAL EXAM (per limitations of virtual telephone visit): The patient is alert and oriented x 3, exhibiting adequate mentation, good mood, and ability to speak in full sentences and execute sound judgement.   ASSESSMENT & PLAN: 1.  Autoimmune hemolytic anemia: -  Prednisone tapered off around 12/11/2019. - Reviewed labs from 07/04/2021: Hgb 13.9 / HCT 40.2.  Reticulocytes are 1.7%.  LDH normal 143.  Total bilirubin 0.4. - She does not have any signs of hemolysis at this time. - PLAN: RTC in 6 months with labs before.     2.  Immune mediated thrombocytopenia: - Treated with IVIG and prednisone on 11/19/2017. - Rituximab weekly x4 from 12/13/2017 through 01/03/2018. - Platelet count is 278 on 07/04/2021. - PLAN: Repeat labs and RTC in 6 months   FOLLOW UP INSTRUCTIONS: Labs in 6 months.  Phone visit after labs.    I discussed the assessment and treatment plan with the patient. The patient was provided an opportunity to ask questions and all were answered. The patient agreed with the plan and demonstrated an understanding of the instructions.   The patient was advised to call back or seek an in-person evaluation if the symptoms worsen or if the condition fails to improve as anticipated.  I provided 8 minutes of non-face-to-face time during this encounter.   Tracey Rush, PA-C 07/26/21 1:38 PM

## 2021-07-26 ENCOUNTER — Inpatient Hospital Stay (HOSPITAL_COMMUNITY): Payer: BC Managed Care – PPO | Attending: Hematology | Admitting: Physician Assistant

## 2021-07-26 ENCOUNTER — Other Ambulatory Visit: Payer: Self-pay

## 2021-07-26 DIAGNOSIS — D693 Immune thrombocytopenic purpura: Secondary | ICD-10-CM | POA: Diagnosis not present

## 2021-07-26 DIAGNOSIS — D591 Autoimmune hemolytic anemia, unspecified: Secondary | ICD-10-CM

## 2021-08-08 ENCOUNTER — Other Ambulatory Visit: Payer: Self-pay

## 2021-08-08 ENCOUNTER — Ambulatory Visit (HOSPITAL_COMMUNITY)
Admission: RE | Admit: 2021-08-08 | Discharge: 2021-08-08 | Disposition: A | Discharge status: Home / Self Care | Payer: BC Managed Care – PPO | Source: Ambulatory Visit | Attending: Hematology | Admitting: Hematology

## 2021-08-08 DIAGNOSIS — Z1231 Encounter for screening mammogram for malignant neoplasm of breast: Secondary | ICD-10-CM | POA: Diagnosis present

## 2022-01-19 ENCOUNTER — Inpatient Hospital Stay (HOSPITAL_COMMUNITY): Payer: BC Managed Care – PPO | Attending: Hematology

## 2022-01-19 DIAGNOSIS — D591 Autoimmune hemolytic anemia, unspecified: Secondary | ICD-10-CM | POA: Insufficient documentation

## 2022-01-19 DIAGNOSIS — D693 Immune thrombocytopenic purpura: Secondary | ICD-10-CM | POA: Insufficient documentation

## 2022-01-19 LAB — CBC WITH DIFFERENTIAL/PLATELET
Abs Immature Granulocytes: 0.02 10*3/uL (ref 0.00–0.07)
Basophils Absolute: 0.1 10*3/uL (ref 0.0–0.1)
Basophils Relative: 1 %
Eosinophils Absolute: 0.6 10*3/uL — ABNORMAL HIGH (ref 0.0–0.5)
Eosinophils Relative: 6 %
HCT: 39.9 % (ref 36.0–46.0)
Hemoglobin: 13.5 g/dL (ref 12.0–15.0)
Immature Granulocytes: 0 %
Lymphocytes Relative: 25 %
Lymphs Abs: 2.5 10*3/uL (ref 0.7–4.0)
MCH: 29.3 pg (ref 26.0–34.0)
MCHC: 33.8 g/dL (ref 30.0–36.0)
MCV: 86.7 fL (ref 80.0–100.0)
Monocytes Absolute: 0.5 10*3/uL (ref 0.1–1.0)
Monocytes Relative: 5 %
Neutro Abs: 6.4 10*3/uL (ref 1.7–7.7)
Neutrophils Relative %: 63 %
Platelets: 256 10*3/uL (ref 150–400)
RBC: 4.6 MIL/uL (ref 3.87–5.11)
RDW: 12.7 % (ref 11.5–15.5)
WBC: 10.1 10*3/uL (ref 4.0–10.5)
nRBC: 0 % (ref 0.0–0.2)

## 2022-01-19 LAB — COMPREHENSIVE METABOLIC PANEL
ALT: 16 U/L (ref 0–44)
AST: 21 U/L (ref 15–41)
Albumin: 3.4 g/dL — ABNORMAL LOW (ref 3.5–5.0)
Alkaline Phosphatase: 44 U/L (ref 38–126)
Anion gap: 5 (ref 5–15)
BUN: 10 mg/dL (ref 6–20)
CO2: 25 mmol/L (ref 22–32)
Calcium: 8.7 mg/dL — ABNORMAL LOW (ref 8.9–10.3)
Chloride: 104 mmol/L (ref 98–111)
Creatinine, Ser: 0.77 mg/dL (ref 0.44–1.00)
GFR, Estimated: >60 mL/min (ref >60)
Glucose, Bld: 93 mg/dL (ref 70–99)
Potassium: 3.6 mmol/L (ref 3.5–5.1)
Sodium: 134 mmol/L — ABNORMAL LOW (ref 135–145)
Total Bilirubin: 0.9 mg/dL (ref 0.3–1.2)
Total Protein: 8.1 g/dL (ref 6.5–8.1)

## 2022-01-19 LAB — RETICULOCYTES
Immature Retic Fract: 11.4 % (ref 2.3–15.9)
RBC.: 4.62 MIL/uL (ref 3.87–5.11)
Retic Count, Absolute: 92.9 10*3/uL (ref 19.0–186.0)
Retic Ct Pct: 2 % (ref 0.4–3.1)

## 2022-01-19 LAB — LACTATE DEHYDROGENASE: LDH: 152 U/L (ref 98–192)

## 2022-01-25 NOTE — Progress Notes (Signed)
   Virtual Visit via Telephone Note [facility]  I connected with Tracey Maldonado  on 01/26/22 at  2:16 PM by telephone and verified that I am speaking with the correct person using two identifiers.  Location: Patient: Home Provider: [facility]   I discussed the limitations, risks, security and privacy concerns of performing an evaluation and management service by telephone and the availability of in person appointments. I also discussed with the patient that there may be a patient responsible charge related to this service. The patient expressed understanding and agreed to proceed.  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  She was las evaluated via telemedicine visit by Tarri Abernethy PA-C on 07/26/2021.  At today's visit, she reports feeling well.  No fatigue, fever, chills, shortness of breath, cough, chest pain, nausea, vomiting, abdominal pain.  Denies any signs or symptoms of blood loss. No frequent infections.  No abnormal bruising or petechial rash.  No current signs or symptoms of blood clots.   She reports 100% energy and 100% appetite.      OBSERVATIONS/OBJECTIVE: Review of Systems  Constitutional:  Negative for chills, diaphoresis, fever, malaise/fatigue and weight loss.  Respiratory:  Negative for cough and shortness of breath.   Cardiovascular:  Positive for leg swelling. Negative for chest pain and palpitations.  Gastrointestinal:  Negative for abdominal pain, blood in stool, melena, nausea and vomiting.  Neurological:  Negative for dizziness and headaches.    PHYSICAL EXAM (per limitations of virtual telephone visit): The patient is alert and oriented x 3, exhibiting  adequate mentation, good mood, and ability to speak in full sentences and execute sound judgement.   ASSESSMENT & PLAN: 1.  Autoimmune hemolytic anemia: - Prednisone tapered off around 12/11/2019. - Reviewed labs from 07/04/2021: Hgb 13.9 / HCT 40.2.  Reticulocytes are 1.7%.  LDH normal 143.  Total bilirubin 0.4. - Most recent labs (01/19/2022): Hgb 13.5, total bilirubin 0.9, normal LDH 152, normal reticulocytes 2.0% - She does not have any signs of hemolysis at this time.   - PLAN: RTC in 6 months with labs before.   2.  Immune mediated thrombocytopenia: - Treated with IVIG and prednisone on 11/19/2017. - Rituximab weekly x4 from 12/13/2017 through 01/03/2018. - Platelet count is normal (256) on 01/19/2022 - PLAN: Repeat labs and RTC in 6 months    I discussed the assessment and treatment plan with the patient. The patient was provided an opportunity to ask questions and all were answered. The patient agreed with the plan and demonstrated an understanding of the instructions.   The patient was advised to call back or seek an in-person evaluation if the symptoms worsen or if the condition fails to improve as anticipated.  I provided 8 minutes of non-face-to-face time during this encounter.   Harriett Rush, PA-C 01/26/2022 2:41 PM

## 2022-01-26 ENCOUNTER — Inpatient Hospital Stay (HOSPITAL_BASED_OUTPATIENT_CLINIC_OR_DEPARTMENT_OTHER): Payer: BC Managed Care – PPO | Admitting: Physician Assistant

## 2022-01-26 ENCOUNTER — Encounter (HOSPITAL_COMMUNITY): Payer: Self-pay | Admitting: Physician Assistant

## 2022-01-26 DIAGNOSIS — D591 Autoimmune hemolytic anemia, unspecified: Secondary | ICD-10-CM

## 2022-01-26 DIAGNOSIS — D693 Immune thrombocytopenic purpura: Secondary | ICD-10-CM

## 2022-02-07 ENCOUNTER — Other Ambulatory Visit (HOSPITAL_COMMUNITY): Payer: Self-pay | Admitting: Hematology

## 2022-02-07 DIAGNOSIS — Z1231 Encounter for screening mammogram for malignant neoplasm of breast: Secondary | ICD-10-CM

## 2022-07-28 ENCOUNTER — Inpatient Hospital Stay: Payer: BC Managed Care – PPO | Attending: Physician Assistant

## 2022-07-28 DIAGNOSIS — D591 Autoimmune hemolytic anemia, unspecified: Secondary | ICD-10-CM | POA: Diagnosis present

## 2022-07-28 DIAGNOSIS — R7402 Elevation of levels of lactic acid dehydrogenase (LDH): Secondary | ICD-10-CM | POA: Diagnosis not present

## 2022-07-28 DIAGNOSIS — D693 Immune thrombocytopenic purpura: Secondary | ICD-10-CM | POA: Diagnosis not present

## 2022-07-28 LAB — COMPREHENSIVE METABOLIC PANEL
ALT: 38 U/L (ref 0–44)
AST: 46 U/L — ABNORMAL HIGH (ref 15–41)
Albumin: 3.5 g/dL (ref 3.5–5.0)
Alkaline Phosphatase: 66 U/L (ref 38–126)
Anion gap: 12 (ref 5–15)
BUN: 12 mg/dL (ref 6–20)
CO2: 22 mmol/L (ref 22–32)
Calcium: 9.7 mg/dL (ref 8.9–10.3)
Chloride: 103 mmol/L (ref 98–111)
Creatinine, Ser: 0.77 mg/dL (ref 0.44–1.00)
GFR, Estimated: >60 mL/min (ref >60)
Glucose, Bld: 108 mg/dL — ABNORMAL HIGH (ref 70–99)
Potassium: 3.5 mmol/L (ref 3.5–5.1)
Sodium: 137 mmol/L (ref 135–145)
Total Bilirubin: 1.2 mg/dL (ref 0.3–1.2)
Total Protein: 9 g/dL — ABNORMAL HIGH (ref 6.5–8.1)

## 2022-07-28 LAB — CBC WITH DIFFERENTIAL/PLATELET
Abs Immature Granulocytes: 0.04 10*3/uL (ref 0.00–0.07)
Basophils Absolute: 0.1 10*3/uL (ref 0.0–0.1)
Basophils Relative: 1 %
Eosinophils Absolute: 0.7 10*3/uL — ABNORMAL HIGH (ref 0.0–0.5)
Eosinophils Relative: 7 %
HCT: 37.9 % (ref 36.0–46.0)
Hemoglobin: 13 g/dL (ref 12.0–15.0)
Immature Granulocytes: 0 %
Lymphocytes Relative: 17 %
Lymphs Abs: 1.7 10*3/uL (ref 0.7–4.0)
MCH: 30.2 pg (ref 26.0–34.0)
MCHC: 34.3 g/dL (ref 30.0–36.0)
MCV: 88.1 fL (ref 80.0–100.0)
Monocytes Absolute: 0.4 10*3/uL (ref 0.1–1.0)
Monocytes Relative: 4 %
Neutro Abs: 7.3 10*3/uL (ref 1.7–7.7)
Neutrophils Relative %: 71 %
Platelets: 273 10*3/uL (ref 150–400)
RBC: 4.3 MIL/uL (ref 3.87–5.11)
RDW: 13.7 % (ref 11.5–15.5)
WBC: 10.3 10*3/uL (ref 4.0–10.5)
nRBC: 0 % (ref 0.0–0.2)

## 2022-07-28 LAB — LACTATE DEHYDROGENASE: LDH: 212 U/L — ABNORMAL HIGH (ref 98–192)

## 2022-07-28 LAB — RETICULOCYTES
Immature Retic Fract: 17.9 % — ABNORMAL HIGH (ref 2.3–15.9)
RBC.: 4.34 MIL/uL (ref 3.87–5.11)
Retic Count, Absolute: 160.1 10*3/uL (ref 19.0–186.0)
Retic Ct Pct: 3.7 % — ABNORMAL HIGH (ref 0.4–3.1)

## 2022-08-04 ENCOUNTER — Inpatient Hospital Stay (HOSPITAL_BASED_OUTPATIENT_CLINIC_OR_DEPARTMENT_OTHER): Payer: BC Managed Care – PPO | Admitting: Physician Assistant

## 2022-08-04 DIAGNOSIS — D693 Immune thrombocytopenic purpura: Secondary | ICD-10-CM

## 2022-08-04 DIAGNOSIS — D591 Autoimmune hemolytic anemia, unspecified: Secondary | ICD-10-CM

## 2022-08-04 NOTE — Progress Notes (Addendum)
Virtual Visit via Telephone Note [facility]  I connected with [name]  on 08/04/22 at  12:16 PM by telephone and verified that I am speaking with the correct person using two identifiers.  Location: Patient: Home Provider: [facility]   I discussed the limitations, risks, security and privacy concerns of performing an evaluation and management service by telephone and the availability of in person appointments. I also discussed with the patient that there may be a patient responsible charge related to this service. The patient expressed understanding and agreed to proceed.  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 46 year old female) is contacted today for follow-up of her autoimmune hemolytic anemia and immune thrombocytopenia.  She was las evaluated via telemedicine visit by Tarri Abernethy PA-C on 01/26/2022.  At today's visit, she reports feeling well.  No fatigue, fever, chills, shortness of breath, cough, chest pain, nausea, vomiting, abdominal pain.  Denies any signs or symptoms of blood loss. No frequent infections.  No abnormal bruising or petechial rash.  No current signs or symptoms of blood clots.   She does report that the week before her labs were checked she had "lots of sneezing," and took Xyzal to relieve her symptoms.  She reports 100% energy and 100% appetite.      OBSERVATIONS/OBJECTIVE: Review of Systems  Constitutional:  Negative for chills, diaphoresis, fever, malaise/fatigue and weight loss.  Respiratory:  Negative for cough and shortness of breath.   Cardiovascular:  Negative for chest pain, palpitations and leg swelling.  Gastrointestinal:  Negative for abdominal pain, blood in stool, melena, nausea and vomiting.  Neurological:  Negative for dizziness and headaches.     PHYSICAL EXAM (per limitations of virtual telephone visit): The patient is alert and oriented x 3, exhibiting adequate mentation, good mood, and ability to speak in full sentences and execute sound judgement.   ASSESSMENT & PLAN: 1.  Autoimmune hemolytic anemia: - Admitted to [facility] on 11/19/2017, with generalized bruising, bleeding gums and heavy menses, found to have a platelet count of 0, treated with IVIG 400 mg/kg/day for 5 days, and prednisone 175 mg daily which was decreased to 100 mg daily at discharge, with platelets improved to 31. - CT scan of the neck, chest, abdomen and pelvis on 11/20/2017 did not show any evidence of mass or adenopathy.  No hepatosplenomegaly. - Prednisone tapered off around 12/11/2019. - There was some question as to whether her initial hemolysis was drug-induced etiology from NSAIDs. - Most recent labs (07/28/2022): Hgb 13.0, total bilirubin 1.2, slightly elevated LDH 212, slightly elevated reticulocytes 3.7% - Increased LDH and mild reticulocytosis may indicate some mild hemolysis - Last menstrual period started on 07/30/2022.  No other signs of bleeding such as rectal bleeding or melena. - She is asymptomatic.  No increased fatigue or dyspnea. - PLAN: We will check repeat CBC/D, reticulocytes, and LDH as well as haptoglobin, Coombs, bilirubin total and direct next week - Will watch for lab results and call patient to discuss results and any changes to her treatment plan - Otherwise, we will plan on RTC in 6 months with labs before.   2.  Immune mediated thrombocytopenia: - Admitted to [facility] on 11/19/2017, with generalized bruising, bleeding gums and heavy menses, found to have a platelet count of 0, treated with IVIG 400 mg/kg/day for 5 days, and prednisone  175 mg daily which was decreased to 100 mg daily at discharge, with platelets improved to 31. - CT scan of the neck, chest, abdomen and pelvis on 11/20/2017 did not show any  evidence of mass or adenopathy.  No hepatosplenomegaly. - Treated with IVIG and prednisone on 11/19/2017. - Rituximab weekly x4 from 12/13/2017 through 01/03/2018. - Prednisone tapered off around 12/11/2019 - Platelet count is normal (273) on 07/28/2022 - PLAN: Repeat labs and RTC in 6 months   PLAN SUMMARY: >> Labs next Monday, 08/07/2022 (CBC/D, reticulocytes, LDH, haptoglobin, DAT/Coombs, total bilirubin, direct bilirubin) - watch for results and call patient to discuss >> Labs in 6 months (CBC/D, CMP, LDH, reticulocytes) + PHONE visit 1 week after labs   I discussed the assessment and treatment plan with the patient. The patient was provided an opportunity to ask questions and all were answered. The patient agreed with the plan and demonstrated an understanding of the instructions.   The patient was advised to call back or seek an in-person evaluation if the symptoms worsen or if the condition fails to improve as anticipated.  I provided 13 minutes of non-face-to-face time during this encounter.   Harriett Rush, PA-C 08/04/2022 12:32 PM

## 2022-08-07 ENCOUNTER — Other Ambulatory Visit: Payer: Self-pay

## 2022-08-07 ENCOUNTER — Inpatient Hospital Stay: Payer: BC Managed Care – PPO | Admitting: Physician Assistant

## 2022-08-07 DIAGNOSIS — D591 Autoimmune hemolytic anemia, unspecified: Secondary | ICD-10-CM | POA: Diagnosis not present

## 2022-08-07 DIAGNOSIS — D693 Immune thrombocytopenic purpura: Secondary | ICD-10-CM

## 2022-08-07 LAB — RETICULOCYTES
Immature Retic Fract: 19 % — ABNORMAL HIGH (ref 2.3–15.9)
RBC.: 3.95 MIL/uL (ref 3.87–5.11)
Retic Count, Absolute: 172.6 10*3/uL (ref 19.0–186.0)
Retic Ct Pct: 4.4 % — ABNORMAL HIGH (ref 0.4–3.1)

## 2022-08-07 LAB — CBC WITH DIFFERENTIAL/PLATELET
Abs Immature Granulocytes: 0.04 10*3/uL (ref 0.00–0.07)
Basophils Absolute: 0.1 10*3/uL (ref 0.0–0.1)
Basophils Relative: 1 %
Eosinophils Absolute: 1.1 10*3/uL — ABNORMAL HIGH (ref 0.0–0.5)
Eosinophils Relative: 10 %
HCT: 36.7 % (ref 36.0–46.0)
Hemoglobin: 12.4 g/dL (ref 12.0–15.0)
Immature Granulocytes: 0 %
Lymphocytes Relative: 21 %
Lymphs Abs: 2.3 10*3/uL (ref 0.7–4.0)
MCH: 30.8 pg (ref 26.0–34.0)
MCHC: 33.8 g/dL (ref 30.0–36.0)
MCV: 91.3 fL (ref 80.0–100.0)
Monocytes Absolute: 0.7 10*3/uL (ref 0.1–1.0)
Monocytes Relative: 6 %
Neutro Abs: 6.8 10*3/uL (ref 1.7–7.7)
Neutrophils Relative %: 62 %
Platelets: 273 10*3/uL (ref 150–400)
RBC: 4.02 MIL/uL (ref 3.87–5.11)
RDW: 14 % (ref 11.5–15.5)
WBC: 11 10*3/uL — ABNORMAL HIGH (ref 4.0–10.5)
nRBC: 0 % (ref 0.0–0.2)

## 2022-08-07 LAB — BILIRUBIN, DIRECT: Bilirubin, Direct: 0.2 mg/dL (ref 0.0–0.2)

## 2022-08-07 LAB — BILIRUBIN, TOTAL: Total Bilirubin: 1.5 mg/dL — ABNORMAL HIGH (ref 0.3–1.2)

## 2022-08-07 LAB — LACTATE DEHYDROGENASE: LDH: 210 U/L — ABNORMAL HIGH (ref 98–192)

## 2022-08-08 LAB — DIRECT ANTIGLOBULIN TEST (NOT AT ARMC)
DAT, IgG: POSITIVE
DAT, complement: POSITIVE

## 2022-08-08 LAB — HAPTOGLOBIN: Haptoglobin: <10 mg/dL — ABNORMAL LOW (ref 42–296)

## 2022-08-09 ENCOUNTER — Other Ambulatory Visit: Payer: Self-pay | Admitting: Physician Assistant

## 2022-08-09 ENCOUNTER — Telehealth: Payer: Self-pay

## 2022-08-09 DIAGNOSIS — D591 Autoimmune hemolytic anemia, unspecified: Secondary | ICD-10-CM

## 2022-08-09 MED ORDER — PREDNISONE 10 MG PO TABS: 40.0000 mg | ORAL_TABLET | Freq: Every day | ORAL | 0 refills | Status: DC

## 2022-08-09 NOTE — Progress Notes (Signed)
Telephone encounter made for patient.

## 2022-08-09 NOTE — Telephone Encounter (Signed)
Spoke with the patient and denied any recent infections and antibiotics.  She used Advil the last week of November for monthly cycle for two to three days.  She has used Prednisone in the past from Dr. Delton Coombes and is aware of measures needed for taking steroids.  All questions asked and answered.

## 2022-08-09 NOTE — Progress Notes (Signed)
Repeat labs show persistent evidence of hemolysis.  Hemoglobin is normal, but she has persistently elevated reticulocytes 4.4%, DAT/Coombs positive with warm antibodies, low haptoglobin, elevated LDH, elevated indirect bilirubin.  Prescription sent to pharmacy for prednisone 40 mg daily for the next 3 weeks.  Will repeat hemolysis labs in 2 weeks and call patient with results/further instructions at that time.  (Labs in 2 weeks = CBC/D, direct bilirubin, total bilirubin, LDH, reticulocytes).

## 2022-08-09 NOTE — Telephone Encounter (Signed)
-----   Message from Harriett Rush, [place] sent at 08/09/2022  9:05 AM EST ----- Labs show evidence of mild hemolysis.  Please call and ask re: recent infections, antibiotics, or NSAIDS.  Rx sent to pharmacy for prednisone 40 mg daily x 3 weeks.  Needs repeat CBC in 2 weeks.

## 2022-08-10 ENCOUNTER — Ambulatory Visit (HOSPITAL_COMMUNITY)
Admission: RE | Admit: 2022-08-10 | Discharge: 2022-08-10 | Disposition: A | Discharge status: Home / Self Care | Payer: BC Managed Care – PPO | Source: Ambulatory Visit | Attending: Hematology | Admitting: Hematology

## 2022-08-10 DIAGNOSIS — Z1231 Encounter for screening mammogram for malignant neoplasm of breast: Secondary | ICD-10-CM | POA: Insufficient documentation

## 2022-08-23 ENCOUNTER — Other Ambulatory Visit: Payer: Self-pay

## 2022-08-23 DIAGNOSIS — D591 Autoimmune hemolytic anemia, unspecified: Secondary | ICD-10-CM

## 2022-08-24 ENCOUNTER — Inpatient Hospital Stay: Payer: BC Managed Care – PPO | Attending: Hematology | Admitting: Physician Assistant

## 2022-08-24 ENCOUNTER — Other Ambulatory Visit: Payer: Self-pay | Admitting: Physician Assistant

## 2022-08-24 DIAGNOSIS — D591 Autoimmune hemolytic anemia, unspecified: Secondary | ICD-10-CM

## 2022-08-24 DIAGNOSIS — R7402 Elevation of levels of lactic acid dehydrogenase (LDH): Secondary | ICD-10-CM | POA: Insufficient documentation

## 2022-08-24 LAB — CBC
HCT: 39.5 % (ref 36.0–46.0)
Hemoglobin: 13.1 g/dL (ref 12.0–15.0)
MCH: 30.8 pg (ref 26.0–34.0)
MCHC: 33.2 g/dL (ref 30.0–36.0)
MCV: 92.7 fL (ref 80.0–100.0)
Platelets: 300 10*3/uL (ref 150–400)
RBC: 4.26 MIL/uL (ref 3.87–5.11)
RDW: 14.2 % (ref 11.5–15.5)
WBC: 14 10*3/uL — ABNORMAL HIGH (ref 4.0–10.5)
nRBC: 0 % (ref 0.0–0.2)

## 2022-08-24 MED ORDER — PREDNISONE 10 MG PO TABS: ORAL_TABLET | ORAL | 0 refills | Status: AC

## 2022-08-24 NOTE — Progress Notes (Signed)
Please call patient to discuss and coordinate the following: - CBC today shows stable/improved hemoglobin.  Platelets are normal. - We will start to taper down patient's steroids.  I have sent Rx to pharmacy for prednisone 30 mg daily x 3 days followed by prednisone 20 mg daily x 1 week. - Please arrange for lab check in 1 week (CBC/D, direct bilirubin, total bilirubin, LDH, reticulocytes). - Will call with results/further instructions at that time.

## 2022-09-01 ENCOUNTER — Other Ambulatory Visit: Payer: Self-pay

## 2022-09-01 DIAGNOSIS — D591 Autoimmune hemolytic anemia, unspecified: Secondary | ICD-10-CM

## 2022-09-04 ENCOUNTER — Telehealth: Payer: Self-pay

## 2022-09-04 ENCOUNTER — Inpatient Hospital Stay: Payer: BC Managed Care – PPO | Admitting: Physician Assistant

## 2022-09-04 DIAGNOSIS — D591 Autoimmune hemolytic anemia, unspecified: Secondary | ICD-10-CM | POA: Diagnosis not present

## 2022-09-04 LAB — CBC WITH DIFFERENTIAL/PLATELET
Abs Immature Granulocytes: 0.03 10*3/uL (ref 0.00–0.07)
Basophils Absolute: 0.1 10*3/uL (ref 0.0–0.1)
Basophils Relative: 0 %
Eosinophils Absolute: 0.1 10*3/uL (ref 0.0–0.5)
Eosinophils Relative: 1 %
HCT: 40.7 % (ref 36.0–46.0)
Hemoglobin: 13.3 g/dL (ref 12.0–15.0)
Immature Granulocytes: 0 %
Lymphocytes Relative: 9 %
Lymphs Abs: 1.1 10*3/uL (ref 0.7–4.0)
MCH: 30.5 pg (ref 26.0–34.0)
MCHC: 32.7 g/dL (ref 30.0–36.0)
MCV: 93.3 fL (ref 80.0–100.0)
Monocytes Absolute: 0.4 10*3/uL (ref 0.1–1.0)
Monocytes Relative: 3 %
Neutro Abs: 11.1 10*3/uL — ABNORMAL HIGH (ref 1.7–7.7)
Neutrophils Relative %: 87 %
Platelets: 278 10*3/uL (ref 150–400)
RBC: 4.36 MIL/uL (ref 3.87–5.11)
RDW: 13.6 % (ref 11.5–15.5)
WBC: 12.7 10*3/uL — ABNORMAL HIGH (ref 4.0–10.5)
nRBC: 0 % (ref 0.0–0.2)

## 2022-09-04 LAB — RETICULOCYTES
Immature Retic Fract: 14.9 % (ref 2.3–15.9)
RBC.: 4.41 MIL/uL (ref 3.87–5.11)
Retic Count, Absolute: 116.9 10*3/uL (ref 19.0–186.0)
Retic Ct Pct: 2.7 % (ref 0.4–3.1)

## 2022-09-04 LAB — LACTATE DEHYDROGENASE: LDH: 168 U/L (ref 98–192)

## 2022-09-04 LAB — BILIRUBIN, DIRECT: Bilirubin, Direct: 0.1 mg/dL (ref 0.0–0.2)

## 2022-09-04 LAB — BILIRUBIN, TOTAL: Total Bilirubin: 0.8 mg/dL (ref 0.3–1.2)

## 2022-09-04 NOTE — Telephone Encounter (Signed)
Patient stated she spoke with a nurse last week for Prednisone instructions.  Repeating labs as directed.

## 2022-09-06 ENCOUNTER — Telehealth: Payer: Self-pay

## 2022-09-06 ENCOUNTER — Other Ambulatory Visit: Payer: Self-pay | Admitting: Physician Assistant

## 2022-09-06 DIAGNOSIS — D591 Autoimmune hemolytic anemia, unspecified: Secondary | ICD-10-CM

## 2022-09-06 MED ORDER — PREDNISONE 10 MG PO TABS: ORAL_TABLET | ORAL | 0 refills | Status: AC

## 2022-09-06 NOTE — Telephone Encounter (Signed)
-----  Message from Harriett Rush, [place] sent at 09/06/2022  8:17 AM EST ----- Nora Rooke:  Please call to discuss the following -   Labs from Monday look great!  No evidence of ongoing hemolysis.  We can start to taper her off of her prednisone as follows: - Continue prednisone 20 mg (2 tablets) daily x 3 days - Then prednisone 10 mg (1 tablet) daily x 3 days - Then prednisone 5 mg (half tablet) daily x 2 days - Then prednisone STOP  When you call to discuss with her, please ask her to check to see if she still has enough of her prednisone at home.  (The above taper will require 10 tablets of he prednisone 10 mg.)  Please let me know if she needs a new prescription to finish out the steroid taper.  Please schedule for additional labs in 6 weeks (reticulocytes, LDH, CBC/D).

## 2022-09-06 NOTE — Progress Notes (Signed)
BONNIE:  Please call to discuss the following -   Labs from Monday look great!  No evidence of ongoing hemolysis.  We can start to taper her off of her prednisone as follows: - Continue prednisone 20 mg (2 tablets) daily x 3 days - Then prednisone 10 mg (1 tablet) daily x 3 days - Then prednisone 5 mg (half tablet) daily x 2 days - Then prednisone STOP  When you call to discuss with her, please ask her to check to see if she still has enough of her prednisone at home.  (The above taper will require 10 tablets of he prednisone 10 mg.)  Please let me know if she needs a new prescription to finish out the steroid taper.  Please schedule for additional labs in 6 weeks (reticulocytes, LDH, CBC/D).

## 2022-09-06 NOTE — Telephone Encounter (Signed)
Hello! A new prescription will be sent with directions but I also wanted to include them in this note.  Finish the Prednisone 20 mg x 1 week, she just needs to take it for 3 more days, and then taper down to the 10 mg, 5 mg, etc.  If you do not get the new script today call and I will take care of it for you.    Thank you!

## 2022-09-06 NOTE — Progress Notes (Signed)
Prescription for prednisone taper sent to pharmacy: - Continue prednisone 20 mg (2 tablets) daily x 3 days - Then prednisone 10 mg (1 tablet) daily x 3 days - Then prednisone 5 mg (half tablet) daily x 2 days - Then prednisone STOP

## 2022-09-11 ENCOUNTER — Other Ambulatory Visit: Payer: BC Managed Care – PPO

## 2022-10-18 ENCOUNTER — Other Ambulatory Visit: Payer: Self-pay

## 2022-10-18 DIAGNOSIS — D693 Immune thrombocytopenic purpura: Secondary | ICD-10-CM

## 2022-10-18 DIAGNOSIS — D591 Autoimmune hemolytic anemia, unspecified: Secondary | ICD-10-CM

## 2022-10-19 ENCOUNTER — Other Ambulatory Visit: Payer: BC Managed Care – PPO

## 2022-10-24 ENCOUNTER — Inpatient Hospital Stay: Payer: BC Managed Care – PPO | Attending: Hematology

## 2022-10-24 DIAGNOSIS — D693 Immune thrombocytopenic purpura: Secondary | ICD-10-CM | POA: Insufficient documentation

## 2022-10-24 DIAGNOSIS — D591 Autoimmune hemolytic anemia, unspecified: Secondary | ICD-10-CM | POA: Diagnosis present

## 2022-10-24 LAB — RETICULOCYTES
Immature Retic Fract: 9.6 % (ref 2.3–15.9)
RBC.: 4.8 MIL/uL (ref 3.87–5.11)
Retic Count, Absolute: 71 10*3/uL (ref 19.0–186.0)
Retic Ct Pct: 1.5 % (ref 0.4–3.1)

## 2022-10-24 LAB — CBC WITH DIFFERENTIAL/PLATELET
Abs Immature Granulocytes: 0.01 10*3/uL (ref 0.00–0.07)
Basophils Absolute: 0.1 10*3/uL (ref 0.0–0.1)
Basophils Relative: 1 %
Eosinophils Absolute: 0.4 10*3/uL (ref 0.0–0.5)
Eosinophils Relative: 4 %
HCT: 42 % (ref 36.0–46.0)
Hemoglobin: 13.9 g/dL (ref 12.0–15.0)
Immature Granulocytes: 0 %
Lymphocytes Relative: 32 %
Lymphs Abs: 3.2 10*3/uL (ref 0.7–4.0)
MCH: 29.1 pg (ref 26.0–34.0)
MCHC: 33.1 g/dL (ref 30.0–36.0)
MCV: 88.1 fL (ref 80.0–100.0)
Monocytes Absolute: 0.6 10*3/uL (ref 0.1–1.0)
Monocytes Relative: 6 %
Neutro Abs: 5.8 10*3/uL (ref 1.7–7.7)
Neutrophils Relative %: 57 %
Platelets: 253 10*3/uL (ref 150–400)
RBC: 4.77 MIL/uL (ref 3.87–5.11)
RDW: 12 % (ref 11.5–15.5)
WBC: 10.2 10*3/uL (ref 4.0–10.5)
nRBC: 0 % (ref 0.0–0.2)

## 2022-10-24 LAB — COMPREHENSIVE METABOLIC PANEL
ALT: 17 U/L (ref 0–44)
AST: 21 U/L (ref 15–41)
Albumin: 3.4 g/dL — ABNORMAL LOW (ref 3.5–5.0)
Alkaline Phosphatase: 39 U/L (ref 38–126)
Anion gap: 8 (ref 5–15)
BUN: 13 mg/dL (ref 6–20)
CO2: 24 mmol/L (ref 22–32)
Calcium: 8.8 mg/dL — ABNORMAL LOW (ref 8.9–10.3)
Chloride: 102 mmol/L (ref 98–111)
Creatinine, Ser: 0.73 mg/dL (ref 0.44–1.00)
GFR, Estimated: >60 mL/min (ref >60)
Glucose, Bld: 93 mg/dL (ref 70–99)
Potassium: 3.6 mmol/L (ref 3.5–5.1)
Sodium: 134 mmol/L — ABNORMAL LOW (ref 135–145)
Total Bilirubin: 0.6 mg/dL (ref 0.3–1.2)
Total Protein: 8 g/dL (ref 6.5–8.1)

## 2022-10-24 LAB — LACTATE DEHYDROGENASE: LDH: 148 U/L (ref 98–192)

## 2023-01-26 ENCOUNTER — Other Ambulatory Visit: Payer: Self-pay

## 2023-01-26 DIAGNOSIS — D591 Autoimmune hemolytic anemia, unspecified: Secondary | ICD-10-CM

## 2023-01-29 ENCOUNTER — Inpatient Hospital Stay: Payer: BC Managed Care – PPO | Attending: Hematology

## 2023-01-29 DIAGNOSIS — D693 Immune thrombocytopenic purpura: Secondary | ICD-10-CM | POA: Diagnosis present

## 2023-01-29 DIAGNOSIS — D591 Autoimmune hemolytic anemia, unspecified: Secondary | ICD-10-CM | POA: Diagnosis present

## 2023-01-29 LAB — COMPREHENSIVE METABOLIC PANEL
ALT: 23 U/L (ref 0–44)
AST: 31 U/L (ref 15–41)
Albumin: 3.2 g/dL — ABNORMAL LOW (ref 3.5–5.0)
Alkaline Phosphatase: 44 U/L (ref 38–126)
Anion gap: 8 (ref 5–15)
BUN: 13 mg/dL (ref 6–20)
CO2: 24 mmol/L (ref 22–32)
Calcium: 8.9 mg/dL (ref 8.9–10.3)
Chloride: 102 mmol/L (ref 98–111)
Creatinine, Ser: 0.76 mg/dL (ref 0.44–1.00)
GFR, Estimated: >60 mL/min (ref >60)
Glucose, Bld: 93 mg/dL (ref 70–99)
Potassium: 3.6 mmol/L (ref 3.5–5.1)
Sodium: 134 mmol/L — ABNORMAL LOW (ref 135–145)
Total Bilirubin: 0.7 mg/dL (ref 0.3–1.2)
Total Protein: 8 g/dL (ref 6.5–8.1)

## 2023-01-29 LAB — CBC WITH DIFFERENTIAL/PLATELET
Abs Immature Granulocytes: 0.09 10*3/uL — ABNORMAL HIGH (ref 0.00–0.07)
Basophils Absolute: 0.1 10*3/uL (ref 0.0–0.1)
Basophils Relative: 1 %
Eosinophils Absolute: 0.6 10*3/uL — ABNORMAL HIGH (ref 0.0–0.5)
Eosinophils Relative: 6 %
HCT: 40.4 % (ref 36.0–46.0)
Hemoglobin: 13.5 g/dL (ref 12.0–15.0)
Immature Granulocytes: 1 %
Lymphocytes Relative: 28 %
Lymphs Abs: 2.8 10*3/uL (ref 0.7–4.0)
MCH: 29.3 pg (ref 26.0–34.0)
MCHC: 33.4 g/dL (ref 30.0–36.0)
MCV: 87.8 fL (ref 80.0–100.0)
Monocytes Absolute: 0.7 10*3/uL (ref 0.1–1.0)
Monocytes Relative: 7 %
Neutro Abs: 5.7 10*3/uL (ref 1.7–7.7)
Neutrophils Relative %: 57 %
Platelets: 275 10*3/uL (ref 150–400)
RBC: 4.6 MIL/uL (ref 3.87–5.11)
RDW: 14.2 % (ref 11.5–15.5)
WBC: 9.9 10*3/uL (ref 4.0–10.5)
nRBC: 0 % (ref 0.0–0.2)

## 2023-01-29 LAB — RETICULOCYTES
Immature Retic Fract: 15.9 % (ref 2.3–15.9)
RBC.: 4.58 MIL/uL (ref 3.87–5.11)
Retic Count, Absolute: 119.1 10*3/uL (ref 19.0–186.0)
Retic Ct Pct: 2.6 % (ref 0.4–3.1)

## 2023-01-29 LAB — LACTATE DEHYDROGENASE: LDH: 149 U/L (ref 98–192)

## 2023-02-04 NOTE — Progress Notes (Unsigned)
Virtual Visit via Telephone Note [facility]  I connected with Tracey Maldonado  on 02/05/2023 at 1:05 PM by telephone and verified that I am speaking with the correct person using two identifiers.  Location: Patient: Home Provider: [facility]   I discussed the limitations, risks, security and privacy concerns of performing an evaluation and management service by telephone and the availability of in person appointments. I also discussed with the patient that there may be a patient responsible charge related to this service. The patient expressed understanding and agreed to proceed.  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 47 year old female) is contacted today for follow-up of her autoimmune hemolytic anemia and immune thrombocytopenia.  She was las evaluated via telemedicine visit by Rojelio Brenner PA-C on 08/04/2022.  At that time, she was found to have evidence of acute hemolysis, which was successfully treated with prednisone taper.  At today's visit, she reports feeling fairly well.   No fatigue, fever, chills, shortness of breath, cough, chest pain, nausea, vomiting, abdominal pain.   Denies any signs or symptoms of blood loss. No frequent infections.   No abnormal bruising or petechial rash.  No current signs or symptoms of blood clots.  She reports 80% energy and 100% appetite.    OBSERVATIONS/OBJECTIVE: Review of Systems  Constitutional:  Negative for chills, diaphoresis, fever, malaise/fatigue and weight loss.  Respiratory:  Negative for cough and shortness of breath.   Cardiovascular:  Negative for chest pain, palpitations and leg swelling.  Gastrointestinal:  Negative for abdominal pain, blood in stool, melena, nausea and vomiting.  Neurological:  Negative for dizziness and headaches.    PHYSICAL EXAM (per limitations of virtual telephone visit): The patient is alert and oriented x 3, exhibiting adequate mentation, good mood, and ability to speak in full sentences and execute sound judgement.  ASSESSMENT & PLAN: 1.  Autoimmune hemolytic anemia: - Admitted to [facility] on 11/19/2017, with generalized bruising, bleeding gums and heavy menses, found to have a platelet count of 0, treated with IVIG 400 mg/kg/day for 5 days, and prednisone 175 mg daily which was decreased to 100 mg daily at discharge, with platelets improved to 31. - CT scan of the neck, chest, abdomen and pelvis on 11/20/2017 did not show any evidence of mass or adenopathy.  No hepatosplenomegaly. - Prednisone tapered off around 12/11/2019. - There was some question as to whether her initial hemolysis was drug-induced etiology from NSAIDs. - Required prednisone taper for acute hemolysis in December 2023.  She did not require hospitalization. - Most recent labs (01/29/2023): Hgb 13.5, total bilirubin 0.7, normal LDH 149, normal reticulocytes 2.6%  - She is asymptomatic. - PLAN: No evidence of acute hemolysis at this time.  We will plan on RTC in 6 months with labs before.   2.  Immune mediated thrombocytopenia: - Admitted to [facility] on 11/19/2017, with generalized bruising, bleeding gums and heavy menses, found to have a platelet count of 0, treated with IVIG 400 mg/kg/day for 5 days, and prednisone 175 mg daily which was decreased to 100 mg daily at discharge, with platelets improved to 31. - CT scan of the neck, chest, abdomen and pelvis on 11/20/2017 did not show any evidence of mass or adenopathy.  No hepatosplenomegaly. - Treated with IVIG and prednisone on 11/19/2017. - Rituximab weekly x4 from 12/13/2017 through 01/03/2018. -  Prednisone tapered off around 12/11/2019 - Platelet count is normal (275) on 01/29/2023 - PLAN: Repeat labs and RTC in 6 months   PLAN SUMMARY: >> Labs in 6  months (CBC/D, CMP, LDH, reticulocytes)  >> OFFICE visit in 6 months (1 week after labs)    I discussed the assessment and treatment plan with the patient. The patient was provided an opportunity to ask questions and all were answered. The patient agreed with the plan and demonstrated an understanding of the instructions.   The patient was advised to call back or seek an in-person evaluation if the symptoms worsen or if the condition fails to improve as anticipated.  I provided 13 minutes of non-face-to-face time during this encounter.  Carnella Guadalajara, PA-C 02/05/2023 1:18 PM

## 2023-02-05 ENCOUNTER — Inpatient Hospital Stay (HOSPITAL_BASED_OUTPATIENT_CLINIC_OR_DEPARTMENT_OTHER): Payer: BC Managed Care – PPO | Admitting: Physician Assistant

## 2023-02-05 DIAGNOSIS — D693 Immune thrombocytopenic purpura: Secondary | ICD-10-CM | POA: Diagnosis not present

## 2023-02-05 DIAGNOSIS — D591 Autoimmune hemolytic anemia, unspecified: Secondary | ICD-10-CM | POA: Diagnosis not present

## 2023-07-23 ENCOUNTER — Other Ambulatory Visit (HOSPITAL_COMMUNITY): Payer: Self-pay | Admitting: Hematology

## 2023-07-23 DIAGNOSIS — Z1231 Encounter for screening mammogram for malignant neoplasm of breast: Secondary | ICD-10-CM

## 2023-07-30 ENCOUNTER — Encounter: Payer: Self-pay | Admitting: Physician Assistant

## 2023-07-31 ENCOUNTER — Inpatient Hospital Stay: Payer: BC Managed Care – PPO | Attending: Hematology

## 2023-07-31 DIAGNOSIS — D591 Autoimmune hemolytic anemia, unspecified: Secondary | ICD-10-CM | POA: Diagnosis present

## 2023-07-31 DIAGNOSIS — D693 Immune thrombocytopenic purpura: Secondary | ICD-10-CM | POA: Diagnosis present

## 2023-07-31 DIAGNOSIS — Z87891 Personal history of nicotine dependence: Secondary | ICD-10-CM | POA: Insufficient documentation

## 2023-07-31 LAB — COMPREHENSIVE METABOLIC PANEL
ALT: 19 U/L (ref 0–44)
AST: 27 U/L (ref 15–41)
Albumin: 3.4 g/dL — ABNORMAL LOW (ref 3.5–5.0)
Alkaline Phosphatase: 47 U/L (ref 38–126)
Anion gap: 15 (ref 5–15)
BUN: 12 mg/dL (ref 6–20)
CO2: 22 mmol/L (ref 22–32)
Calcium: 9.4 mg/dL (ref 8.9–10.3)
Chloride: 100 mmol/L (ref 98–111)
Creatinine, Ser: 0.76 mg/dL (ref 0.44–1.00)
GFR, Estimated: >60 mL/min (ref >60)
Glucose, Bld: 102 mg/dL — ABNORMAL HIGH (ref 70–99)
Potassium: 3.7 mmol/L (ref 3.5–5.1)
Sodium: 137 mmol/L (ref 135–145)
Total Bilirubin: 0.9 mg/dL (ref <1.2)
Total Protein: 8.7 g/dL — ABNORMAL HIGH (ref 6.5–8.1)

## 2023-07-31 LAB — CBC WITH DIFFERENTIAL/PLATELET
Abs Immature Granulocytes: 0.03 10*3/uL (ref 0.00–0.07)
Basophils Absolute: 0.1 10*3/uL (ref 0.0–0.1)
Basophils Relative: 1 %
Eosinophils Absolute: 0.3 10*3/uL (ref 0.0–0.5)
Eosinophils Relative: 3 %
HCT: 43.1 % (ref 36.0–46.0)
Hemoglobin: 14.3 g/dL (ref 12.0–15.0)
Immature Granulocytes: 0 %
Lymphocytes Relative: 28 %
Lymphs Abs: 3.5 10*3/uL (ref 0.7–4.0)
MCH: 29.2 pg (ref 26.0–34.0)
MCHC: 33.2 g/dL (ref 30.0–36.0)
MCV: 88 fL (ref 80.0–100.0)
Monocytes Absolute: 0.8 10*3/uL (ref 0.1–1.0)
Monocytes Relative: 6 %
Neutro Abs: 8.1 10*3/uL — ABNORMAL HIGH (ref 1.7–7.7)
Neutrophils Relative %: 62 %
Platelets: 336 10*3/uL (ref 150–400)
RBC: 4.9 MIL/uL (ref 3.87–5.11)
RDW: 13 % (ref 11.5–15.5)
WBC: 12.9 10*3/uL — ABNORMAL HIGH (ref 4.0–10.5)
nRBC: 0 % (ref 0.0–0.2)

## 2023-07-31 LAB — LACTATE DEHYDROGENASE: LDH: 149 U/L (ref 98–192)

## 2023-07-31 LAB — RETICULOCYTES
Immature Retic Fract: 10 % (ref 2.3–15.9)
RBC.: 4.99 MIL/uL (ref 3.87–5.11)
Retic Count, Absolute: 89.8 10*3/uL (ref 19.0–186.0)
Retic Ct Pct: 1.8 % (ref 0.4–3.1)

## 2023-08-06 NOTE — Progress Notes (Signed)
[facility] 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  She was las evaluated via telemedicine visit by Rojelio Brenner PA-C on 02/05/2023.    At today's visit, she reports feeling fairly well.  She has some baseline dyspnea on exertion that she attributes to her obesity.  No fatigue, fever, chills, cough, chest pain, nausea, vomiting, abdominal pain.   Denies any signs or symptoms of blood loss.  No frequent infections.   No abnormal bruising or petechial rash.   No current signs or symptoms of blood clots.  She reports 100% energy and 100% appetite.    ASSESSMENT & PLAN:  1.  Autoimmune hemolytic anemia: - Admitted to [facility] on 11/19/2017, with generalized bruising, bleeding gums and heavy menses, found to have a platelet count of 0, treated with IVIG 400 mg/kg/day for 5 days, and prednisone 175 mg daily which was decreased to 100 mg daily at discharge, with platelets improved to 31. - CT scan of the neck, chest, abdomen and pelvis on 11/20/2017 did not show any evidence of mass or adenopathy.  No hepatosplenomegaly. - Prednisone tapered off around 12/11/2019. - There was some question as to whether her initial hemolysis was drug-induced etiology from NSAIDs. - Required prednisone taper for acute hemolysis in December 2023.  She did not require hospitalization. - Most recent labs (07/31/2023): Hgb 14.3, total bilirubin 0.9, normal LDH 149, normal reticulocytes 1.8 %  - She is asymptomatic. - PLAN: No evidence of acute hemolysis at this time.  We  will plan on PHONE visit in 6 months with labs before.   2.  Immune mediated thrombocytopenia: - Admitted to [facility] on 11/19/2017, with generalized bruising, bleeding gums and heavy menses, found to have a platelet count of 0, treated with IVIG 400 mg/kg/day for 5 days, and prednisone 175 mg daily which was decreased to 100 mg daily at discharge, with platelets improved to 31. - CT scan of the neck, chest, abdomen and pelvis on 11/20/2017 did not show any evidence of mass or adenopathy.  No hepatosplenomegaly. - Treated with IVIG and prednisone on 11/19/2017. - Rituximab weekly x4 from 12/13/2017 through 01/03/2018. - Prednisone tapered off around 12/11/2019 - Platelet count is normal (336) on 07/31/2023 - PLAN: Repeat labs and RTC in 6 months     PLAN SUMMARY: >> Labs in 6 months (CBC/D, CMP, LDH, reticulocytes)  >> PHONE visit in 6 months (1 week after labs)  ** Last office visit 08/07/23 (requires office visits every 2 years)      REVIEW OF SYSTEMS:   Review of Systems  Constitutional:  Negative for appetite change, chills, diaphoresis, fatigue, fever and unexpected weight change.  HENT:   Negative for lump/mass and nosebleeds.   Eyes:  Negative for eye problems.  Respiratory:  Positive for shortness of breath (with exertion). Negative for cough and hemoptysis.   Cardiovascular:  Negative for chest pain, leg swelling and palpitations.  Gastrointestinal:  Negative for abdominal pain, blood in stool, constipation, diarrhea, nausea and vomiting.  Genitourinary:  Negative for hematuria.   Skin: Negative.   Neurological:  Negative for  dizziness, headaches and light-headedness.  Hematological:  Does not bruise/bleed easily.     PHYSICAL EXAM:  ECOG PERFORMANCE STATUS: 1 - Symptomatic but completely ambulatory  Vitals:   08/07/23 1256  BP: (!) 172/92  Pulse: (!) 112  Resp: 18  Temp: 98.5 F (36.9 C)  SpO2: 100%   Filed Weights   08/07/23 1256  Weight: (!) 441 lb  9.6 oz (200.3 kg)   Physical Exam Constitutional:      Appearance: Normal appearance. She is morbidly obese.  Cardiovascular:     Rate and Rhythm: Tachycardia present.     Heart sounds: Normal heart sounds.  Pulmonary:     Breath sounds: Decreased breath sounds present.     Comments: Limited auscultation of lung sounds secondary to morbid obesity. Neurological:     General: No focal deficit present.     Mental Status: Mental status is at baseline.  Psychiatric:        Behavior: Behavior normal. Behavior is cooperative.     PAST MEDICAL/SURGICAL HISTORY:  No past medical history on file. Past Surgical History:  Procedure Laterality Date   APPENDECTOMY  1984    SOCIAL HISTORY:  Social History   Socioeconomic History   Marital status: Widowed    Spouse name: Not on file   Number of children: Not on file   Years of education: Not on file   Highest education level: Not on file  Occupational History   Not on file  Tobacco Use   Smoking status: Former    Current packs/day: 0.00    Average packs/day: 0.3 packs/day for 5.0 years (1.3 ttl pk-yrs)    Types: Cigarettes    Start date: 11/26/1996    Quit date: 11/26/2001    Years since quitting: 21.7   Smokeless tobacco: Never  Vaping Use   Vaping status: Never Used  Substance and Sexual Activity   Alcohol use: Yes    Comment: Occassionally   Drug use: Never   Sexual activity: Not on file  Other Topics Concern   Not on file  Social History Narrative   Not on file   Social Drivers of Health   Financial Resource Strain: Not on file  Food Insecurity: Not on file  Transportation Needs: Not on file  Physical Activity: Not on file  Stress: Not on file  Social Connections: Not on file  Intimate Partner Violence: Not on file    FAMILY HISTORY:  Family History  Problem Relation Age of Onset   Hypertension Mother    Diabetes Paternal Grandmother     CURRENT MEDICATIONS:  Outpatient Encounter Medications as of  08/07/2023  Medication Sig   [facility] PAIN RELIEF REGULAR ST 325 MG tablet Take 325 mg by mouth as needed.   ESTARYLLA 0.25-35 MG-MCG tablet Take 1 tablet by mouth daily.    furosemide (LASIX) 20 MG tablet Take 1 tablet (20 mg total) by mouth daily as needed.   [DISCONTINUED] neomycin-polymyxin-hydrocortisone (CORTISPORIN) 3.5-10000-1 OTIC suspension INSTILL 4 DROPS INTO AFFECTED EAR(S) BY OTIC ROUTE 3 TIMES PER DAY x 7 DAYS   No facility-administered encounter medications on file as of 08/07/2023.    ALLERGIES:  No Known Allergies  LABORATORY DATA:  I have reviewed the labs as listed.  CBC    Component Value Date/Time   WBC 12.9 (H) 07/31/2023 1059   RBC 4.99 07/31/2023 1059   RBC 4.90 07/31/2023 1059   HGB 14.3 07/31/2023 1059   HCT 43.1 07/31/2023 1059   PLT 336 07/31/2023  1059   MCV 88.0 07/31/2023 1059   MCH 29.2 07/31/2023 1059   MCHC 33.2 07/31/2023 1059   RDW 13.0 07/31/2023 1059   LYMPHSABS 3.5 07/31/2023 1059   MONOABS 0.8 07/31/2023 1059   EOSABS 0.3 07/31/2023 1059   BASOSABS 0.1 07/31/2023 1059      Latest Ref Rng & Units 07/31/2023   10:59 AM 01/29/2023   12:14 PM 10/24/2022   12:33 PM  CMP  Glucose 70 - 99 mg/dL 454  93  93   BUN 6 - 20 mg/dL 12  13  13    Creatinine 0.44 - 1.00 mg/dL 0.98  1.19  1.47   Sodium 135 - 145 mmol/L 137  134  134   Potassium 3.5 - 5.1 mmol/L 3.7  3.6  3.6   Chloride 98 - 111 mmol/L 100  102  102   CO2 22 - 32 mmol/L 22  24  24    Calcium 8.9 - 10.3 mg/dL 9.4  8.9  8.8   Total Protein 6.5 - 8.1 g/dL 8.7  8.0  8.0   Total Bilirubin <1.2 mg/dL 0.9  0.7  0.6   Alkaline Phos 38 - 126 U/L 47  44  39   AST 15 - 41 U/L 27  31  21    ALT 0 - 44 U/L 19  23  17      DIAGNOSTIC IMAGING:  I have independently reviewed the relevant imaging and discussed with the patient.   WRAP UP:  All questions were answered. The patient knows to call the clinic with any problems, questions or concerns.  Medical decision making: Low  Time spent on  visit: I spent 15 minutes counseling the patient face to face. The total time spent in the appointment was 22 minutes and more than 50% was on counseling.  Carnella Guadalajara, PA-C  08/07/23 1:30 PM

## 2023-08-07 ENCOUNTER — Inpatient Hospital Stay (HOSPITAL_BASED_OUTPATIENT_CLINIC_OR_DEPARTMENT_OTHER): Payer: BC Managed Care – PPO | Admitting: Physician Assistant

## 2023-08-07 VITALS — BP 172/92 | HR 112 | Temp 98.5°F | Resp 18 | Wt >= 6400 oz

## 2023-08-07 DIAGNOSIS — D693 Immune thrombocytopenic purpura: Secondary | ICD-10-CM | POA: Diagnosis not present

## 2023-08-07 DIAGNOSIS — D591 Autoimmune hemolytic anemia, unspecified: Secondary | ICD-10-CM | POA: Diagnosis not present

## 2023-08-13 ENCOUNTER — Ambulatory Visit (HOSPITAL_COMMUNITY)
Admission: RE | Admit: 2023-08-13 | Discharge: 2023-08-13 | Disposition: A | Discharge status: Home / Self Care | Payer: BC Managed Care – PPO | Source: Ambulatory Visit | Attending: Hematology | Admitting: Hematology

## 2023-08-13 DIAGNOSIS — Z1231 Encounter for screening mammogram for malignant neoplasm of breast: Secondary | ICD-10-CM | POA: Diagnosis present

## 2024-01-31 ENCOUNTER — Inpatient Hospital Stay: Payer: BC Managed Care – PPO | Attending: Hematology

## 2024-01-31 DIAGNOSIS — D693 Immune thrombocytopenic purpura: Secondary | ICD-10-CM | POA: Insufficient documentation

## 2024-01-31 DIAGNOSIS — D591 Autoimmune hemolytic anemia, unspecified: Secondary | ICD-10-CM | POA: Insufficient documentation

## 2024-01-31 LAB — COMPREHENSIVE METABOLIC PANEL WITH GFR
ALT: 21 U/L (ref 0–44)
AST: 32 U/L (ref 15–41)
Albumin: 3.5 g/dL (ref 3.5–5.0)
Alkaline Phosphatase: 59 U/L (ref 38–126)
Anion gap: 12 (ref 5–15)
BUN: 11 mg/dL (ref 6–20)
CO2: 22 mmol/L (ref 22–32)
Calcium: 9.1 mg/dL (ref 8.9–10.3)
Chloride: 100 mmol/L (ref 98–111)
Creatinine, Ser: 0.76 mg/dL (ref 0.44–1.00)
GFR, Estimated: >60 mL/min (ref >60)
Glucose, Bld: 103 mg/dL — ABNORMAL HIGH (ref 70–99)
Potassium: 3.6 mmol/L (ref 3.5–5.1)
Sodium: 134 mmol/L — ABNORMAL LOW (ref 135–145)
Total Bilirubin: 1.4 mg/dL — ABNORMAL HIGH (ref 0.0–1.2)
Total Protein: 8.5 g/dL — ABNORMAL HIGH (ref 6.5–8.1)

## 2024-01-31 LAB — CBC WITH DIFFERENTIAL/PLATELET
Abs Immature Granulocytes: 0.03 10*3/uL (ref 0.00–0.07)
Basophils Absolute: 0.1 10*3/uL (ref 0.0–0.1)
Basophils Relative: 1 %
Eosinophils Absolute: 0.4 10*3/uL (ref 0.0–0.5)
Eosinophils Relative: 5 %
HCT: 41.1 % (ref 36.0–46.0)
Hemoglobin: 13.3 g/dL (ref 12.0–15.0)
Immature Granulocytes: 0 %
Lymphocytes Relative: 31 %
Lymphs Abs: 2.7 10*3/uL (ref 0.7–4.0)
MCH: 28.7 pg (ref 26.0–34.0)
MCHC: 32.4 g/dL (ref 30.0–36.0)
MCV: 88.6 fL (ref 80.0–100.0)
Monocytes Absolute: 0.7 10*3/uL (ref 0.1–1.0)
Monocytes Relative: 8 %
Neutro Abs: 4.8 10*3/uL (ref 1.7–7.7)
Neutrophils Relative %: 55 %
Platelets: 286 10*3/uL (ref 150–400)
RBC: 4.64 MIL/uL (ref 3.87–5.11)
RDW: 13.5 % (ref 11.5–15.5)
WBC: 8.7 10*3/uL (ref 4.0–10.5)
nRBC: 0 % (ref 0.0–0.2)

## 2024-01-31 LAB — RETICULOCYTES
Immature Retic Fract: 15 % (ref 2.3–15.9)
RBC.: 4.68 MIL/uL (ref 3.87–5.11)
Retic Count, Absolute: 115.1 10*3/uL (ref 19.0–186.0)
Retic Ct Pct: 2.5 % (ref 0.4–3.1)

## 2024-01-31 LAB — LACTATE DEHYDROGENASE: LDH: 182 U/L (ref 98–192)

## 2024-02-05 NOTE — Progress Notes (Signed)
 VIRTUAL VISIT via TELEPHONE NOTE [facility]   I connected with Tracey Maldonado  on 02/06/24 at  8:33 AM by telephone and verified that I am speaking with the correct person using two identifiers.  Location: Patient: Home Provider: [facility]   I discussed the limitations, risks, security and privacy concerns of performing an evaluation and management service by telephone and the availability of in person appointments. I also discussed with the patient that there may be a patient responsible charge related to this service. The patient expressed understanding and agreed to proceed.  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  She was last seen by Sheril Dines PA-C on 08/07/2023.  At today's visit, she reports feeling fairly well, apart from some plantar fasciitis.  She has been taking Advil for relief of pain and inflammation. She has some baseline dyspnea on exertion that she attributes to her obesity. No fatigue, fever, chills, cough, chest pain, nausea, vomiting, abdominal pain. Denies any signs or symptoms of blood loss. No frequent infections.    No abnormal bruising or petechial rash.    No current signs or symptoms of blood clots. She reports 100% energy and 100% appetite.    ASSESSMENT & PLAN:  1.  Autoimmune hemolytic anemia: - Admitted to [facility] on 11/19/2017, with generalized bruising, bleeding gums and heavy menses, found to have a platelet count of 0, treated with IVIG 400 mg/kg/day for 5 days, and prednisone  175 mg daily which was decreased to 100 mg daily at discharge, with platelets improved to 31. - CT scan of the neck, chest, abdomen and pelvis on 11/20/2017 did not show  any evidence of mass or adenopathy.  No hepatosplenomegaly. - Prednisone  tapered off around 12/11/2019. - There was some question as to whether her initial hemolysis was drug-induced etiology from NSAIDs. - Required prednisone  taper for acute hemolysis in December 2023.  She did not require hospitalization. - Most recent labs (01/31/2024): Hgb 13.3, total bilirubin mildly elevated at 1.4, normal LDH 182, normal reticulocytes 2.5%  - She is asymptomatic. - PLAN: Minimally elevated bilirubin is noted, but with normal LDH, reticulocytes, and hemoglobin there is no other significant evidence of acute hemolysis. - Discussed with patient the importance of avoiding all NSAID medications.  Recommended that she follow-up with podiatrist or orthopedist for other treatments of her acute plantar fasciitis. - RTC 6 months, or sooner if needed  2.  Immune mediated thrombocytopenia: - Admitted to [facility] on 11/19/2017, with generalized bruising, bleeding gums and heavy menses, found to have a platelet count of 0, treated with IVIG 400 mg/kg/day for 5 days, and prednisone  175 mg daily which was decreased to 100 mg daily at discharge, with platelets improved to 31. - CT scan of the neck, chest, abdomen and pelvis on 11/20/2017 did not show any evidence of mass or adenopathy.  No hepatosplenomegaly. - Treated with IVIG and prednisone  on 11/19/2017. - Rituximab  weekly x4 from 12/13/2017 through 01/03/2018. - Prednisone  tapered off around 12/11/2019 - Platelet count is normal (286) on 01/31/2024 - PLAN: Repeat labs and RTC in 6 months     PLAN SUMMARY: >> Labs in 6 months (CBC/D, CMP, fractionated bilirubin, LDH, reticulocytes)  >> PHONE visit in 6 months (1 week after  labs)  ** Last office visit 08/07/23 (requires office visits every 2 years)      REVIEW OF SYSTEMS:   Review of Systems  Constitutional:  Negative for chills, diaphoresis, fever, malaise/fatigue and weight loss.  Respiratory:  Negative  for cough and shortness of breath.   Cardiovascular:  Negative for chest pain and palpitations.  Gastrointestinal:  Negative for abdominal pain, blood in stool, melena, nausea and vomiting.  Neurological:  Negative for dizziness and headaches.     PHYSICAL EXAM: (per limitations of virtual telephone visit)  The patient is alert and oriented x 3, exhibiting adequate mentation, good mood, and ability to speak in full sentences and execute sound judgement.  WRAP UP:   I discussed the assessment and treatment plan with the patient. The patient was provided an opportunity to ask questions and all were answered. The patient agreed with the plan and demonstrated an understanding of the instructions.   The patient was advised to call back or seek an in-person evaluation if the symptoms worsen or if the condition fails to improve as anticipated.  I provided 22 minutes of non-face-to-face time during this encounter, including >10 minutes of medical discussion.  Sonnie Dusky, PA-C 02/06/24 8:53 AM

## 2024-02-06 ENCOUNTER — Inpatient Hospital Stay: Payer: BC Managed Care – PPO | Admitting: Physician Assistant

## 2024-02-06 DIAGNOSIS — D591 Autoimmune hemolytic anemia, unspecified: Secondary | ICD-10-CM

## 2024-02-06 DIAGNOSIS — D693 Immune thrombocytopenic purpura: Secondary | ICD-10-CM | POA: Diagnosis not present

## 2024-03-17 NOTE — Progress Notes (Signed)
 Erroneous encounter

## 2024-03-19 ENCOUNTER — Other Ambulatory Visit (HOSPITAL_COMMUNITY): Payer: Self-pay | Admitting: Hematology

## 2024-03-19 DIAGNOSIS — Z1231 Encounter for screening mammogram for malignant neoplasm of breast: Secondary | ICD-10-CM

## 2024-08-04 ENCOUNTER — Other Ambulatory Visit

## 2024-08-04 ENCOUNTER — Inpatient Hospital Stay: Attending: Physician Assistant

## 2024-08-04 DIAGNOSIS — D591 Autoimmune hemolytic anemia, unspecified: Secondary | ICD-10-CM

## 2024-08-04 DIAGNOSIS — D693 Immune thrombocytopenic purpura: Secondary | ICD-10-CM

## 2024-08-04 LAB — COMPREHENSIVE METABOLIC PANEL WITH GFR
ALT: 17 U/L (ref 0–44)
AST: 28 U/L (ref 15–41)
Albumin: 4.3 g/dL (ref 3.5–5.0)
Alkaline Phosphatase: 79 U/L (ref 38–126)
Anion gap: 17 — ABNORMAL HIGH (ref 5–15)
BUN: 13 mg/dL (ref 6–20)
CO2: 21 mmol/L — ABNORMAL LOW (ref 22–32)
Calcium: 9.7 mg/dL (ref 8.9–10.3)
Chloride: 99 mmol/L (ref 98–111)
Creatinine, Ser: 0.74 mg/dL (ref 0.44–1.00)
GFR, Estimated: >60 mL/min (ref >60)
Glucose, Bld: 100 mg/dL — ABNORMAL HIGH (ref 70–99)
Potassium: 3.7 mmol/L (ref 3.5–5.1)
Sodium: 137 mmol/L (ref 135–145)
Total Bilirubin: 0.9 mg/dL (ref 0.0–1.2)
Total Protein: 9.3 g/dL — ABNORMAL HIGH (ref 6.5–8.1)

## 2024-08-04 LAB — CBC WITH DIFFERENTIAL/PLATELET
Abs Immature Granulocytes: 0.04 K/uL (ref 0.00–0.07)
Basophils Absolute: 0.1 K/uL (ref 0.0–0.1)
Basophils Relative: 1 %
Eosinophils Absolute: 0.5 K/uL (ref 0.0–0.5)
Eosinophils Relative: 4 %
HCT: 42.8 % (ref 36.0–46.0)
Hemoglobin: 14 g/dL (ref 12.0–15.0)
Immature Granulocytes: 0 %
Lymphocytes Relative: 28 %
Lymphs Abs: 3.1 K/uL (ref 0.7–4.0)
MCH: 28.5 pg (ref 26.0–34.0)
MCHC: 32.7 g/dL (ref 30.0–36.0)
MCV: 87.2 fL (ref 80.0–100.0)
Monocytes Absolute: 0.7 K/uL (ref 0.1–1.0)
Monocytes Relative: 6 %
Neutro Abs: 6.7 K/uL (ref 1.7–7.7)
Neutrophils Relative %: 61 %
Platelets: 333 K/uL (ref 150–400)
RBC: 4.91 MIL/uL (ref 3.87–5.11)
RDW: 13.7 % (ref 11.5–15.5)
WBC: 11 K/uL — ABNORMAL HIGH (ref 4.0–10.5)
nRBC: 0 % (ref 0.0–0.2)

## 2024-08-04 LAB — RETICULOCYTES
Immature Retic Fract: 11.3 % (ref 2.3–15.9)
RBC.: 4.91 MIL/uL (ref 3.87–5.11)
Retic Count, Absolute: 101.1 K/uL (ref 19.0–186.0)
Retic Ct Pct: 2.1 % (ref 0.4–3.1)

## 2024-08-04 LAB — LACTATE DEHYDROGENASE: LDH: 215 U/L (ref 105–235)

## 2024-08-11 ENCOUNTER — Telehealth: Admitting: Physician Assistant

## 2024-08-11 NOTE — Progress Notes (Unsigned)
 "  VIRTUAL VISIT via TELEPHONE NOTE [facility]   I connected with Tracey Maldonado  on 08/12/2024 at 10:30 AM by telephone and verified that I am speaking with the correct person using two identifiers.  Location: Patient: Home Provider: [facility] [place]   I discussed the limitations, risks, security and privacy concerns of performing an evaluation and management service by telephone and the availability of in person appointments. I also discussed with the patient that there may be a patient responsible charge related to this service. The patient expressed understanding and agreed to proceed.  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  She was evaluated via telemedicine visit by Tracey Barefoot PA-C on 02/06/2024.  At today's visit, she reports feeling well. She reports 100% energy and 100% appetite.   She uses Tylenol  for pain, tries to avoid NSAIDs.  She does admit to taking Advil once a month or so. No fatigue, chest pain, dyspnea, nausea, vomiting, abdominal pain. Denies any signs or symptoms of blood loss. No frequent infections.    No abnormal bruising or petechial rash.    No current signs or symptoms of blood clots.  ASSESSMENT & PLAN:  1.  Autoimmune hemolytic anemia: - Admitted to [facility] on 11/19/2017, with generalized bruising, bleeding gums and heavy menses, found to have a platelet count of 0, treated with IVIG 400 mg/kg/day for 5 days, and prednisone  175 mg daily which was decreased to 100 mg daily at discharge, with platelets improved to 31. - CT scan of the neck, chest, abdomen and pelvis on 11/20/2017 did not show any evidence of mass or adenopathy.  No hepatosplenomegaly. -  Prednisone  tapered off around 12/11/2019. - There was some question as to whether her initial hemolysis was drug-induced etiology from NSAIDs. - Required prednisone  taper for acute hemolysis in December 2023.  She did not require hospitalization. - She uses Tylenol  for pain, tries to avoid NSAIDs.  She does admit to taking Advil once a month or so. - Most recent labs (08/04/2024): Hgb 14.0, total bilirubin normal at 0.9, with normal LDH 215, and normal reticulocytes 2.1%. - She is asymptomatic. - PLAN: No evidence of acute hemolysis. - Discussed with patient the importance of avoiding all NSAID medications. - RTC 6 months, or sooner if needed  2.  Immune mediated thrombocytopenia: - Admitted to [facility] on 11/19/2017, with generalized bruising, bleeding gums and heavy menses, found to have a platelet count of 0, treated with IVIG 400 mg/kg/day for 5 days, and prednisone  175 mg daily which was decreased to 100 mg daily at discharge, with platelets improved to 31. - CT scan of the neck, chest, abdomen and pelvis on 11/20/2017 did not show any evidence of mass or adenopathy.  No hepatosplenomegaly. - Treated with IVIG and prednisone  on 11/19/2017. - Rituximab  weekly x4 from 12/13/2017 through 01/03/2018. - Prednisone  tapered off around 12/11/2019 - Platelet count is normal (333) on 08/04/2024 - PLAN: Repeat labs and RTC in 6 months  3.  Elevated total protein - CMP over the past year shows slowly increasing total protein - Most recent CMP (08/04/2024) with total protein 9.3 - Patient does report being dehydrated at times, especially around the time of her menstrual period - PLAN: We discussed that potential  causes of elevated protein could include dehydration, certain infections (HIV and hepatitis), autoimmune disease, liver disease, and myeloma. - Recommended additional laboratory workup.  Patient would like to wait until her next 6 months visit to obtain these labs, but is aware that  she can call to have them done sooner if she changes her mind. - Will add the following to her next labs - SPEP, immunofixation, light chains, HIV, viral hepatitis, ANA, RF, anti-CCP, CMP     PLAN SUMMARY: >> Labs in 6 months (CBC/D, CMP, LDH, reticulocytes, SPEP, immunofixation, kappa/lambda light chain, HIV, hepatitis panel, ANA, RF, anti-CCP)  >> OFFICE visit in 6 months (1 week after labs)  ** Last office visit 08/07/23 (requires office visits every 2 years)      REVIEW OF SYSTEMS:   Review of Systems  Constitutional:  Negative for chills, diaphoresis, fever, malaise/fatigue and weight loss.  Respiratory:  Negative for cough and shortness of breath.   Cardiovascular:  Negative for chest pain and palpitations.  Gastrointestinal:  Negative for abdominal pain, blood in stool, melena, nausea and vomiting.  Neurological:  Negative for dizziness and headaches.     PHYSICAL EXAM: (per limitations of virtual telephone visit)  The patient is alert and oriented x 3, exhibiting adequate mentation, good mood, and ability to speak in full sentences and execute sound judgement.  WRAP UP:   I discussed the assessment and treatment plan with the patient. The patient was provided an opportunity to ask questions and all were answered. The patient agreed with the plan and demonstrated an understanding of the instructions.   The patient was advised to call back or seek an in-person evaluation if the symptoms worsen or if the condition fails to improve as anticipated.  I provided 22 minutes of non-face-to-face time during this encounter, including >10 minutes of medical discussion.  Tracey CHRISTELLA Barefoot, PA-C 08/12/2024 11:54 AM  "

## 2024-08-12 ENCOUNTER — Inpatient Hospital Stay (HOSPITAL_BASED_OUTPATIENT_CLINIC_OR_DEPARTMENT_OTHER): Admitting: Physician Assistant

## 2024-08-12 DIAGNOSIS — R778 Other specified abnormalities of plasma proteins: Secondary | ICD-10-CM

## 2024-08-12 DIAGNOSIS — D693 Immune thrombocytopenic purpura: Secondary | ICD-10-CM

## 2024-08-12 DIAGNOSIS — D591 Autoimmune hemolytic anemia, unspecified: Secondary | ICD-10-CM

## 2024-08-13 ENCOUNTER — Inpatient Hospital Stay: Admitting: Physician Assistant

## 2024-08-18 ENCOUNTER — Encounter: Payer: Self-pay | Admitting: *Deleted

## 2024-08-18 ENCOUNTER — Ambulatory Visit (HOSPITAL_COMMUNITY)
Admission: RE | Admit: 2024-08-18 | Discharge: 2024-08-18 | Disposition: A | Discharge status: Home / Self Care | Source: Ambulatory Visit | Attending: Hematology | Admitting: Hematology

## 2024-08-18 DIAGNOSIS — Z1231 Encounter for screening mammogram for malignant neoplasm of breast: Secondary | ICD-10-CM | POA: Diagnosis present

## 2025-02-09 ENCOUNTER — Inpatient Hospital Stay

## 2025-02-16 ENCOUNTER — Inpatient Hospital Stay: Admitting: Physician Assistant
